# Patient Record
Sex: Female | Born: 1955
Health system: Southern US, Community
[De-identification: ages and names within clinical notes are randomized; demographics above are authoritative.]

## PROBLEM LIST (undated history)

## (undated) DIAGNOSIS — E785 Hyperlipidemia, unspecified: Secondary | ICD-10-CM

## (undated) DIAGNOSIS — I10 Essential (primary) hypertension: Secondary | ICD-10-CM

## (undated) DIAGNOSIS — I639 Cerebral infarction, unspecified: Secondary | ICD-10-CM

## (undated) HISTORY — PX: TUBAL LIGATION: SHX77

## (undated) HISTORY — DX: Hyperlipidemia, unspecified: E78.5

---

## 2000-12-23 ENCOUNTER — Ambulatory Visit (HOSPITAL_COMMUNITY): Admission: RE | Admit: 2000-12-23 | Discharge: 2000-12-23 | Payer: Self-pay | Admitting: Family Medicine

## 2000-12-23 ENCOUNTER — Encounter: Payer: Self-pay | Admitting: Family Medicine

## 2002-10-25 ENCOUNTER — Emergency Department (HOSPITAL_COMMUNITY): Admission: EM | Admit: 2002-10-25 | Discharge: 2002-10-25 | Payer: Self-pay | Admitting: Emergency Medicine

## 2002-12-06 ENCOUNTER — Emergency Department (HOSPITAL_COMMUNITY): Admission: EM | Admit: 2002-12-06 | Discharge: 2002-12-06 | Payer: Self-pay | Admitting: *Deleted

## 2003-09-01 ENCOUNTER — Emergency Department (HOSPITAL_COMMUNITY): Admission: EM | Admit: 2003-09-01 | Discharge: 2003-09-01 | Payer: Self-pay | Admitting: Emergency Medicine

## 2003-12-27 ENCOUNTER — Emergency Department (HOSPITAL_COMMUNITY): Admission: EM | Admit: 2003-12-27 | Discharge: 2003-12-27 | Payer: Self-pay | Admitting: *Deleted

## 2004-09-05 ENCOUNTER — Emergency Department (HOSPITAL_COMMUNITY): Admission: EM | Admit: 2004-09-05 | Discharge: 2004-09-05 | Payer: Self-pay | Admitting: Emergency Medicine

## 2005-09-29 ENCOUNTER — Emergency Department (HOSPITAL_COMMUNITY): Admission: EM | Admit: 2005-09-29 | Discharge: 2005-09-29 | Payer: Self-pay | Admitting: Emergency Medicine

## 2006-05-27 ENCOUNTER — Emergency Department (HOSPITAL_COMMUNITY): Admission: EM | Admit: 2006-05-27 | Discharge: 2006-05-27 | Payer: Self-pay | Admitting: Emergency Medicine

## 2006-09-11 ENCOUNTER — Emergency Department (HOSPITAL_COMMUNITY): Admission: EM | Admit: 2006-09-11 | Discharge: 2006-09-11 | Payer: Self-pay | Admitting: Emergency Medicine

## 2006-10-02 ENCOUNTER — Emergency Department (HOSPITAL_COMMUNITY): Admission: EM | Admit: 2006-10-02 | Discharge: 2006-10-02 | Payer: Self-pay | Admitting: Emergency Medicine

## 2008-02-11 ENCOUNTER — Emergency Department (HOSPITAL_COMMUNITY): Admission: EM | Admit: 2008-02-11 | Discharge: 2008-02-11 | Payer: Self-pay | Admitting: Emergency Medicine

## 2009-04-10 ENCOUNTER — Emergency Department (HOSPITAL_COMMUNITY): Admission: EM | Admit: 2009-04-10 | Discharge: 2009-04-10 | Payer: Self-pay | Admitting: Emergency Medicine

## 2009-11-03 ENCOUNTER — Emergency Department (HOSPITAL_COMMUNITY): Admission: EM | Admit: 2009-11-03 | Discharge: 2009-11-04 | Payer: Self-pay | Admitting: Emergency Medicine

## 2010-06-30 ENCOUNTER — Encounter: Payer: Self-pay | Admitting: Family Medicine

## 2010-09-11 LAB — POCT CARDIAC MARKERS: Troponin i, poc: <0.05 ng/mL (ref 0.00–0.09)

## 2010-09-11 LAB — URINALYSIS, ROUTINE W REFLEX MICROSCOPIC
Nitrite: NEGATIVE
Specific Gravity, Urine: 1.01 (ref 1.005–1.030)
pH: 7 (ref 5.0–8.0)

## 2010-09-11 LAB — BASIC METABOLIC PANEL
GFR calc non Af Amer: >60 mL/min (ref >60)
Potassium: 3.1 mEq/L — ABNORMAL LOW (ref 3.5–5.1)
Sodium: 139 mEq/L (ref 135–145)

## 2010-09-11 LAB — DIFFERENTIAL
Eosinophils Relative: 3 % (ref 0–5)
Lymphocytes Relative: 36 % (ref 12–46)
Lymphs Abs: 1.6 10*3/uL (ref 0.7–4.0)
Monocytes Absolute: 0.3 10*3/uL (ref 0.1–1.0)

## 2010-09-11 LAB — HEPATIC FUNCTION PANEL
AST: 30 U/L (ref 0–37)
Albumin: 4.1 g/dL (ref 3.5–5.2)
Alkaline Phosphatase: 56 U/L (ref 39–117)
Total Protein: 7.6 g/dL (ref 6.0–8.3)

## 2010-09-11 LAB — CBC
HCT: 36.3 % (ref 36.0–46.0)
Hemoglobin: 12.4 g/dL (ref 12.0–15.0)
RBC: 4.24 MIL/uL (ref 3.87–5.11)
WBC: 4.4 10*3/uL (ref 4.0–10.5)

## 2010-09-11 LAB — LIPASE, BLOOD: Lipase: 24 U/L (ref 11–59)

## 2010-11-16 ENCOUNTER — Emergency Department (HOSPITAL_COMMUNITY)
Admission: EM | Admit: 2010-11-16 | Discharge: 2010-11-16 | Disposition: A | Discharge status: Home / Self Care | Payer: Self-pay | Attending: Emergency Medicine | Admitting: Emergency Medicine

## 2010-11-16 DIAGNOSIS — W57XXXA Bitten or stung by nonvenomous insect and other nonvenomous arthropods, initial encounter: Secondary | ICD-10-CM | POA: Insufficient documentation

## 2010-11-16 DIAGNOSIS — T148 Other injury of unspecified body region: Secondary | ICD-10-CM | POA: Insufficient documentation

## 2011-02-25 ENCOUNTER — Other Ambulatory Visit (HOSPITAL_COMMUNITY): Payer: Self-pay | Admitting: Family Medicine

## 2011-02-25 DIAGNOSIS — Z139 Encounter for screening, unspecified: Secondary | ICD-10-CM

## 2011-03-04 ENCOUNTER — Ambulatory Visit (HOSPITAL_COMMUNITY)
Admission: RE | Admit: 2011-03-04 | Discharge: 2011-03-04 | Disposition: A | Discharge status: Home / Self Care | Payer: PRIVATE HEALTH INSURANCE | Source: Ambulatory Visit | Attending: Family Medicine | Admitting: Family Medicine

## 2011-03-04 DIAGNOSIS — Z139 Encounter for screening, unspecified: Secondary | ICD-10-CM

## 2011-03-04 DIAGNOSIS — Z1231 Encounter for screening mammogram for malignant neoplasm of breast: Secondary | ICD-10-CM | POA: Insufficient documentation

## 2011-09-01 ENCOUNTER — Emergency Department (HOSPITAL_COMMUNITY)
Admission: EM | Admit: 2011-09-01 | Discharge: 2011-09-01 | Disposition: A | Discharge status: Home / Self Care | Payer: Self-pay | Attending: Emergency Medicine | Admitting: Emergency Medicine

## 2011-09-01 ENCOUNTER — Encounter (HOSPITAL_COMMUNITY): Payer: Self-pay | Admitting: *Deleted

## 2011-09-01 DIAGNOSIS — I10 Essential (primary) hypertension: Secondary | ICD-10-CM | POA: Insufficient documentation

## 2011-09-01 DIAGNOSIS — K047 Periapical abscess without sinus: Secondary | ICD-10-CM | POA: Insufficient documentation

## 2011-09-01 DIAGNOSIS — K029 Dental caries, unspecified: Secondary | ICD-10-CM | POA: Insufficient documentation

## 2011-09-01 DIAGNOSIS — R220 Localized swelling, mass and lump, head: Secondary | ICD-10-CM | POA: Insufficient documentation

## 2011-09-01 DIAGNOSIS — K089 Disorder of teeth and supporting structures, unspecified: Secondary | ICD-10-CM | POA: Insufficient documentation

## 2011-09-01 HISTORY — DX: Essential (primary) hypertension: I10

## 2011-09-01 MED ORDER — HYDROCODONE-ACETAMINOPHEN 5-325 MG PO TABS
1.0000 | ORAL_TABLET | Freq: Once | ORAL | Status: AC
  Administered 2011-09-01: 1 via ORAL
  Filled 2011-09-01: qty 1

## 2011-09-01 MED ORDER — IBUPROFEN 800 MG PO TABS
800.0000 mg | ORAL_TABLET | Freq: Once | ORAL | Status: AC
  Administered 2011-09-01: 800 mg via ORAL
  Filled 2011-09-01: qty 1

## 2011-09-01 MED ORDER — PENICILLIN V POTASSIUM 500 MG PO TABS: 500.0000 mg | ORAL_TABLET | Freq: Four times a day (QID) | ORAL | Status: AC

## 2011-09-01 MED ORDER — HYDROCODONE-ACETAMINOPHEN 5-325 MG PO TABS: 1.0000 | ORAL_TABLET | Freq: Four times a day (QID) | ORAL | Status: AC | PRN

## 2011-09-01 MED ORDER — PENICILLIN V POTASSIUM 250 MG PO TABS
500.0000 mg | ORAL_TABLET | Freq: Once | ORAL | Status: AC
  Administered 2011-09-01: 500 mg via ORAL
  Filled 2011-09-01: qty 2

## 2011-09-01 NOTE — ED Notes (Signed)
Dental abscess left upper jaw

## 2011-09-01 NOTE — ED Provider Notes (Signed)
History     CSN: [id number]  Arrival date & time 09/01/11  7846   First MD Initiated Contact with Patient 09/01/11 2031      Chief Complaint  Patient presents with  . Dental Pain    (Consider location/radiation/quality/duration/timing/severity/associated sxs/prior treatment) HPI Comments: Tooth "broke off" 2 days ago with swelling and increasing pain.  No dentist.  Took tylenol earlier with significant relief.  Patient is a 56 y.o. female presenting with tooth pain. The history is provided by the patient. No language interpreter was used.  Dental PainThe primary symptoms include mouth pain. The symptoms began 2 days ago. The symptoms are worsening. The symptoms occur constantly.  Additional symptoms include: facial swelling.    Past Medical History  Diagnosis Date  . Hypertension     Past Surgical History  Procedure Date  . Tubal ligation     No family history on file.  History  Substance Use Topics  . Smoking status: Never Smoker   . Smokeless tobacco: Not on file  . Alcohol Use: No    OB History    Grav Para Term Preterm Abortions TAB SAB Ect Mult Living                  Review of Systems  HENT: Positive for facial swelling and dental problem.   All other systems reviewed and are negative.    Allergies  Bee venom and Shellfish allergy  Home Medications   Current Outpatient Rx  Name Route Sig Dispense Refill  . ACETAMINOPHEN 500 MG PO TABS Oral Take 500 mg by mouth once as needed. For pain    . BENAZEPRIL-HYDROCHLOROTHIAZIDE 20-12.5 MG PO TABS Oral Take 1 tablet by mouth daily.    Marland Kitchen DIPHENHYDRAMINE HCL 25 MG PO TABS Oral Take 25 mg by mouth as needed. For allergy symptoms    . OMEGA-3 FATTY ACIDS 1000 MG PO CAPS Oral Take 1 g by mouth 2 (two) times daily.    Marland Kitchen HYDROCHLOROTHIAZIDE 12.5 MG PO CAPS Oral Take 12.5 mg by mouth daily.    . ADULT MULTIVITAMIN W/MINERALS CH Oral Take 1 tablet by mouth daily.      BP 181/96  Pulse 100  Temp(Src) 98.6 F  (37 C) (Oral)  Resp 20  Ht 5\' 2"  (1.575 m)  Wt 144 lb (65.318 kg)  BMI 26.34 kg/m2  SpO2 100%  Physical Exam  Nursing note and vitals reviewed. Constitutional: She is oriented to person, place, and time. She appears well-developed and well-nourished. No distress.  HENT:  Head: Normocephalic and atraumatic.    Mouth/Throat: Uvula is midline and mucous membranes are normal. Dental abscesses and dental caries present. No uvula swelling. No oropharyngeal exudate, posterior oropharyngeal edema, posterior oropharyngeal erythema or tonsillar abscesses.    Eyes: EOM are normal.  Neck: Normal range of motion.  Cardiovascular: Normal rate, regular rhythm and normal heart sounds.   Pulmonary/Chest: Effort normal and breath sounds normal.  Abdominal: Soft. She exhibits no distension. There is no tenderness.  Musculoskeletal: Normal range of motion.  Neurological: She is alert and oriented to person, place, and time.  Skin: Skin is warm and dry.  Psychiatric: She has a normal mood and affect. Judgment normal.    ED Course  Procedures (including critical care time)  Labs Reviewed - No data to display No results found.   1. Dental abscess       MDM  rx-pen VK, 40 rx-hydrocodone, 20 OTC ibuprofen F/u with dentist ASAP  Worthy Rancher, PA 09/01/11 2101  Worthy Rancher, PA 09/01/11 2103

## 2011-09-01 NOTE — Discharge Instructions (Signed)
Dental Abscess A dental abscess usually starts from an infected tooth. Antibiotic medicine and pain pills can be helpful, but dental infections require the attention of a dentist. Rinse around the infected area often with salt water (a pinch of salt in 8 oz of warm water). Do not apply heat to the outside of your face. See your dentist or oral surgeon as soon as possible.  SEEK IMMEDIATE MEDICAL CARE IF:  You have increasing, severe pain that is not relieved by medicine.   You or your child has an oral temperature above 102 F (38.9 C), not controlled by medicine.   Your baby is older than 3 months with a rectal temperature of 102 F (38.9 C) or higher.   Your baby is 39 months old or younger with a rectal temperature of 100.4 F (38 C) or higher.   You develop chills, severe headache, difficulty breathing, or trouble swallowing.   You have swelling in the neck or around the eye.  Document Released: 05/26/2005 Document Revised: 05/15/2011 Document Reviewed: 11/04/2006 [facility] Patient Information 2012 [place], [place].   Take the meds as directed.  Take ibuprofen up to 800 mg every 8 hrs with food.  Follow up  With the dentist of your choice ASAP.

## 2011-09-05 NOTE — ED Provider Notes (Signed)
Medical screening examination/treatment/procedure(s) were performed by non-physician practitioner and as supervising physician I was immediately available for consultation/collaboration.   Annaleese Guier W. Zyan Mirkin, MD 09/05/11 0058 

## 2013-03-16 ENCOUNTER — Encounter: Payer: Self-pay | Admitting: General Practice

## 2013-03-16 ENCOUNTER — Encounter (INDEPENDENT_AMBULATORY_CARE_PROVIDER_SITE_OTHER): Payer: Self-pay

## 2013-03-16 ENCOUNTER — Ambulatory Visit (INDEPENDENT_AMBULATORY_CARE_PROVIDER_SITE_OTHER): Payer: BC Managed Care – PPO | Admitting: General Practice

## 2013-03-16 VITALS — BP 165/91 | HR 86 | Temp 97.6°F | Ht 61.5 in | Wt 156.0 lb

## 2013-03-16 DIAGNOSIS — Z7189 Other specified counseling: Secondary | ICD-10-CM

## 2013-03-16 DIAGNOSIS — I10 Essential (primary) hypertension: Secondary | ICD-10-CM

## 2013-03-16 DIAGNOSIS — Z7689 Persons encountering health services in other specified circumstances: Secondary | ICD-10-CM

## 2013-03-16 MED ORDER — AMLODIPINE BESY-BENAZEPRIL HCL 5-20 MG PO CAPS: 1.0000 | ORAL_CAPSULE | Freq: Every day | ORAL | Status: DC

## 2013-03-16 NOTE — Patient Instructions (Signed)

## 2013-03-16 NOTE — Progress Notes (Signed)
  Subjective:    Patient ID: Tiffany King, female    DOB: 01-31-1956, 57 y.o.   MRN: [id number]  HPI Patient presents today to establish care. She was being seen by local health department for hypertension and acid reflux. She reports being treated for high blood pressure intermittently for 17 years. She reports not taking medications for past two weeks and recently restarted on September 30th. Reports blood pressure normally range 130's/90's.  Reports trying to eat healthy, tossed salads, chicken, fish, baked foods, no salt seasoning. Reports walking at least three times weekly.    Review of Systems  Constitutional: Negative for fever and chills.  Respiratory: Negative for chest tightness and shortness of breath.   Cardiovascular: Negative for chest pain and palpitations.  Gastrointestinal: Negative for nausea, vomiting, abdominal pain, diarrhea, constipation and blood in stool.  Genitourinary: Negative for dysuria, hematuria and difficulty urinating.  Musculoskeletal: Negative for back pain.  Neurological: Negative for dizziness, weakness and headaches.       Objective:   Physical Exam  Constitutional: She is oriented to person, place, and time. She appears well-developed and well-nourished.  HENT:  Head: Normocephalic and atraumatic.  Right Ear: External ear normal.  Left Ear: External ear normal.  Mouth/Throat: Oropharynx is clear and moist.  Eyes: Conjunctivae and EOM are normal. Pupils are equal, round, and reactive to light.  Neck: Normal range of motion. Neck supple. No thyromegaly present.  Cardiovascular: Normal rate, regular rhythm and normal heart sounds.   Pulmonary/Chest: Effort normal and breath sounds normal. No respiratory distress. She exhibits no tenderness.  Abdominal: Soft. Bowel sounds are normal. She exhibits no distension. There is no tenderness.  Lymphadenopathy:    She has no cervical adenopathy.  Neurological: She is alert and oriented to person, place,  and time.  Skin: Skin is warm and dry.  Psychiatric: She has a normal mood and affect.          Assessment & Plan:  1. Hypertension  - amLODipine-benazepril (LOTREL) 5-20 MG per capsule; Take 1 capsule by mouth daily.  Dispense: 30 capsule; Refill: 0 -discontinue Lotensin HCT 20-12.5 - NMR, lipoprofile - CMP14+EGFR -RTO in 2 weeks for blood pressure recheck  2. Encounter to establish care - POCT CBC Discussed healthy eating and regular exercise -Patient verbalized understanding Coralie Keens, FNP-C

## 2013-03-18 ENCOUNTER — Other Ambulatory Visit: Payer: Self-pay | Admitting: General Practice

## 2013-03-18 DIAGNOSIS — E785 Hyperlipidemia, unspecified: Secondary | ICD-10-CM

## 2013-03-18 LAB — CMP14+EGFR
Albumin: 4.9 g/dL (ref 3.5–5.5)
Alkaline Phosphatase: 61 IU/L (ref 39–117)
BUN/Creatinine Ratio: 9 (ref 9–23)
BUN: 9 mg/dL (ref 6–24)
CO2: 29 mmol/L (ref 18–29)
Calcium: 10.3 mg/dL — ABNORMAL HIGH (ref 8.7–10.2)
Creatinine, Ser: 1.01 mg/dL — ABNORMAL HIGH (ref 0.57–1.00)
Globulin, Total: 2.6 g/dL (ref 1.5–4.5)
Total Protein: 7.5 g/dL (ref 6.0–8.5)

## 2013-03-18 LAB — NMR, LIPOPROFILE
LDL Particle Number: 1528 nmol/L — ABNORMAL HIGH (ref <1000)
LDL Size: 20.7 nm (ref >20.5)
Triglycerides by NMR: 151 mg/dL — ABNORMAL HIGH (ref <150)

## 2013-03-18 MED ORDER — ATORVASTATIN CALCIUM 10 MG PO TABS: 10.0000 mg | ORAL_TABLET | Freq: Every day | ORAL | Status: DC

## 2013-03-30 ENCOUNTER — Encounter: Payer: Self-pay | Admitting: *Deleted

## 2013-04-01 ENCOUNTER — Ambulatory Visit: Payer: BC Managed Care – PPO | Admitting: General Practice

## 2013-04-05 ENCOUNTER — Encounter: Payer: Self-pay | Admitting: General Practice

## 2013-04-05 ENCOUNTER — Ambulatory Visit (INDEPENDENT_AMBULATORY_CARE_PROVIDER_SITE_OTHER): Payer: BC Managed Care – PPO | Admitting: General Practice

## 2013-04-05 VITALS — BP 159/84 | HR 84 | Temp 97.0°F | Ht 61.5 in | Wt 156.0 lb

## 2013-04-05 DIAGNOSIS — I10 Essential (primary) hypertension: Secondary | ICD-10-CM

## 2013-04-05 MED ORDER — AMLODIPINE BESY-BENAZEPRIL HCL 5-20 MG PO CAPS: 1.0000 | ORAL_CAPSULE | Freq: Every day | ORAL | Status: DC

## 2013-04-05 NOTE — Progress Notes (Signed)
  Subjective:    Patient ID: Tiffany King, female    DOB: 1955/10/22, 57 y.o.   MRN: [id number]  HPI Patient presents today for follow up of blood pressure. She reports checking blood pressures at home and in stores, with readings of 130's/70's. Reports taking medications as directed. Reports eating healthy diet and walking for exercise. Denies any complaints at this time.     Review of Systems  Constitutional: Negative for fever and chills.  Respiratory: Negative for chest tightness and shortness of breath.   Cardiovascular: Negative for chest pain and palpitations.  Gastrointestinal: Negative for nausea, vomiting, abdominal pain, diarrhea, constipation and blood in stool.  Genitourinary: Negative for dysuria, hematuria and difficulty urinating.  Musculoskeletal: Negative for back pain.  Neurological: Negative for dizziness, weakness and headaches.       Objective:   Physical Exam  Constitutional: She is oriented to person, place, and time. She appears well-developed and well-nourished.  HENT:  Head: Normocephalic and atraumatic.  Right Ear: External ear normal.  Left Ear: External ear normal.  Mouth/Throat: Oropharynx is clear and moist.  Eyes: Conjunctivae and EOM are normal. Pupils are equal, round, and reactive to light.  Neck: Normal range of motion. Neck supple. No thyromegaly present.  Cardiovascular: Normal rate, regular rhythm and normal heart sounds.   Pulmonary/Chest: Effort normal and breath sounds normal. No respiratory distress. She exhibits no tenderness.  Abdominal: Soft. Bowel sounds are normal. She exhibits no distension. There is no tenderness.  Lymphadenopathy:    She has no cervical adenopathy.  Neurological: She is alert and oriented to person, place, and time.  Skin: Skin is warm and dry.  Psychiatric: She has a normal mood and affect.          Assessment & Plan:  1. Hypertension - amLODipine-benazepril (LOTREL) 5-20 MG per capsule; Take 1  capsule by mouth daily.  Dispense: 30 capsule; Refill: 3 -keep blood pressure diary and bring to next visit -continue healthy eating and some form of regular exercise -Patient verbalized understanding -Coralie Keens, FNP-C

## 2013-08-16 ENCOUNTER — Ambulatory Visit (INDEPENDENT_AMBULATORY_CARE_PROVIDER_SITE_OTHER): Payer: BC Managed Care – PPO | Admitting: General Practice

## 2013-08-16 ENCOUNTER — Encounter: Payer: Self-pay | Admitting: General Practice

## 2013-08-16 VITALS — BP 156/86 | HR 93 | Temp 98.1°F | Ht 61.5 in | Wt 162.0 lb

## 2013-08-16 DIAGNOSIS — I10 Essential (primary) hypertension: Secondary | ICD-10-CM

## 2013-08-16 DIAGNOSIS — E785 Hyperlipidemia, unspecified: Secondary | ICD-10-CM

## 2013-08-16 MED ORDER — AMLODIPINE BESY-BENAZEPRIL HCL 5-20 MG PO CAPS: 1.0000 | ORAL_CAPSULE | Freq: Every day | ORAL | Status: DC

## 2013-08-16 MED ORDER — ATORVASTATIN CALCIUM 10 MG PO TABS: 10.0000 mg | ORAL_TABLET | Freq: Every day | ORAL | Status: DC

## 2013-08-16 NOTE — Patient Instructions (Signed)
Exercise to Stay Healthy Exercise helps you become and stay healthy. EXERCISE IDEAS AND TIPS Choose exercises that:  You enjoy.  Fit into your day. You do not need to exercise really hard to be healthy. You can do exercises at a slow or medium level and stay healthy. You can:  Stretch before and after working out.  Try yoga, Pilates, or tai chi.  Lift weights.  Walk fast, swim, jog, run, climb stairs, bicycle, dance, or rollerskate.  Take aerobic classes. Exercises that burn about 150 calories:  Running 1  miles in 15 minutes.  Playing volleyball for 45 to 60 minutes.  Washing and waxing a car for 45 to 60 minutes.  Playing touch football for 45 minutes.  Walking 1  miles in 35 minutes.  Pushing a stroller 1  miles in 30 minutes.  Playing basketball for 30 minutes.  Raking leaves for 30 minutes.  Bicycling 5 miles in 30 minutes.  Walking 2 miles in 30 minutes.  Dancing for 30 minutes.  Shoveling snow for 15 minutes.  Swimming laps for 20 minutes.  Walking up stairs for 15 minutes.  Bicycling 4 miles in 15 minutes.  Gardening for 30 to 45 minutes.  Jumping rope for 15 minutes.  Washing windows or floors for 45 to 60 minutes. Document Released: 06/28/2010 Document Revised: 08/18/2011 Document Reviewed: 06/28/2010 [employer] Patient Information 2014 [employer].  

## 2013-08-16 NOTE — Progress Notes (Signed)
   Subjective:    Patient ID: Tiffany King, female    DOB: September 11, 1955, 58 y.o.   MRN: [id number]  HPI Patient presents today for chronic health follow up. History of HTN, HLD, and Gerd. Taking medications as directed. Eating healthy and working on increasing exercise routine.     Review of Systems  Constitutional: Negative for fever and chills.  Respiratory: Negative for chest tightness and shortness of breath.   Cardiovascular: Negative for chest pain and palpitations.  Gastrointestinal: Negative for nausea, vomiting, abdominal pain, diarrhea, constipation and blood in stool.  Genitourinary: Negative for dysuria, hematuria and difficulty urinating.  Musculoskeletal: Negative for back pain.  Neurological: Negative for dizziness, weakness and headaches.  All other systems reviewed and are negative.       Objective:   Physical Exam  Constitutional: She is oriented to person, place, and time. She appears well-developed and well-nourished.  HENT:  Head: Normocephalic and atraumatic.  Right Ear: External ear normal.  Left Ear: External ear normal.  Mouth/Throat: Oropharynx is clear and moist.  Eyes: Conjunctivae and EOM are normal. Pupils are equal, round, and reactive to light.  Neck: Normal range of motion. Neck supple. No thyromegaly present.  Cardiovascular: Normal rate, regular rhythm and normal heart sounds.   Pulmonary/Chest: Effort normal and breath sounds normal. No respiratory distress. She exhibits no tenderness.  Abdominal: Soft. Bowel sounds are normal. She exhibits no distension. There is no tenderness.  Lymphadenopathy:    She has no cervical adenopathy.  Neurological: She is alert and oriented to person, place, and time.  Skin: Skin is warm and dry.  Psychiatric: She has a normal mood and affect.          Assessment & Plan:  1.Hypertension  - amLODipine-benazepril (LOTREL) 5-20 MG per capsule; Take 1 capsule by mouth daily.  Dispense: 30 capsule; Refill:  5 - CMP14+EGFR  2. HLD (hyperlipidemia)  - atorvastatin (LIPITOR) 10 MG tablet; Take 1 tablet (10 mg total) by mouth daily.  Dispense: 30 tablet; Refill: 5 - Lipid panel -Continue all current medications Labs pending F/u in 3 months Discussed benefits of regular exercise and healthy eating Patient verbalized understanding Erby Pian, FNP-C

## 2013-08-17 LAB — CMP14+EGFR
ALT: 30 IU/L (ref 0–32)
AST: 24 IU/L (ref 0–40)
Albumin/Globulin Ratio: 1.8 (ref 1.1–2.5)
Albumin: 4.9 g/dL (ref 3.5–5.5)
Alkaline Phosphatase: 69 IU/L (ref 39–117)
BUN/Creatinine Ratio: 10 (ref 9–23)
BUN: 10 mg/dL (ref 6–24)
CALCIUM: 10.5 mg/dL — AB (ref 8.7–10.2)
CHLORIDE: 104 mmol/L (ref 97–108)
CO2: 22 mmol/L (ref 18–29)
Creatinine, Ser: 1.03 mg/dL — ABNORMAL HIGH (ref 0.57–1.00)
GFR calc Af Amer: 69 mL/min/{1.73_m2} (ref >59)
GFR calc non Af Amer: 60 mL/min/{1.73_m2} (ref >59)
GLUCOSE: 74 mg/dL (ref 65–99)
Globulin, Total: 2.7 g/dL (ref 1.5–4.5)
POTASSIUM: 4 mmol/L (ref 3.5–5.2)
SODIUM: 146 mmol/L — AB (ref 134–144)
TOTAL PROTEIN: 7.6 g/dL (ref 6.0–8.5)
Total Bilirubin: 0.2 mg/dL (ref 0.0–1.2)

## 2013-08-17 LAB — LIPID PANEL
CHOLESTEROL TOTAL: 243 mg/dL — AB (ref 100–199)
Chol/HDL Ratio: 4.6 ratio units — ABNORMAL HIGH (ref 0.0–4.4)
HDL: 53 mg/dL (ref >39)
LDL Calculated: 135 mg/dL — ABNORMAL HIGH (ref 0–99)
TRIGLYCERIDES: 276 mg/dL — AB (ref 0–149)
VLDL Cholesterol Cal: 55 mg/dL — ABNORMAL HIGH (ref 5–40)

## 2013-08-19 DIAGNOSIS — E785 Hyperlipidemia, unspecified: Secondary | ICD-10-CM | POA: Insufficient documentation

## 2013-08-19 DIAGNOSIS — I10 Essential (primary) hypertension: Secondary | ICD-10-CM | POA: Insufficient documentation

## 2013-08-24 ENCOUNTER — Encounter: Payer: Self-pay | Admitting: General Practice

## 2013-08-24 ENCOUNTER — Ambulatory Visit (INDEPENDENT_AMBULATORY_CARE_PROVIDER_SITE_OTHER): Payer: BC Managed Care – PPO | Admitting: General Practice

## 2013-08-24 ENCOUNTER — Telehealth: Payer: Self-pay | Admitting: *Deleted

## 2013-08-24 VITALS — BP 154/82 | HR 96 | Temp 97.2°F | Ht 61.0 in | Wt 162.0 lb

## 2013-08-24 DIAGNOSIS — Z9109 Other allergy status, other than to drugs and biological substances: Secondary | ICD-10-CM

## 2013-08-24 DIAGNOSIS — Z889 Allergy status to unspecified drugs, medicaments and biological substances status: Secondary | ICD-10-CM

## 2013-08-24 DIAGNOSIS — L509 Urticaria, unspecified: Secondary | ICD-10-CM

## 2013-08-24 DIAGNOSIS — I10 Essential (primary) hypertension: Secondary | ICD-10-CM

## 2013-08-24 MED ORDER — LISINOPRIL-HYDROCHLOROTHIAZIDE 20-12.5 MG PO TABS: 1.0000 | ORAL_TABLET | Freq: Every day | ORAL | Status: DC

## 2013-08-24 NOTE — Patient Instructions (Signed)

## 2013-08-24 NOTE — Telephone Encounter (Signed)
Took Bp medication this am and broke out in hives all over her.

## 2013-08-24 NOTE — Telephone Encounter (Signed)
Patient was seen in our office today.  

## 2013-08-24 NOTE — Progress Notes (Signed)
   Subjective:    Patient ID: Tiffany King, female    DOB: 12/10/1955, 58 y.o.   MRN: [id number]  Urticaria This is a new problem. The current episode started more than 1 month ago. The problem has been gradually worsening since onset. The rash is diffuse. The rash is characterized by itchiness and redness. It is unknown if there was an exposure to a precipitant. Pertinent negatives include no cough, diarrhea or fever. Past treatments include antihistamine. The treatment provided significant relief. There is no history of allergies, asthma or eczema.       Review of Systems  Constitutional: Negative for fever and chills.  Respiratory: Negative for cough and chest tightness.   Cardiovascular: Negative for chest pain and palpitations.  Gastrointestinal: Negative for abdominal pain, diarrhea, constipation and blood in stool.  Skin: Positive for rash.       Red, itchy rash diffuse  Neurological: Negative for dizziness, weakness and headaches.       Objective:   Physical Exam  Constitutional: She is oriented to person, place, and time. She appears well-developed and well-nourished.  HENT:  Head: Normocephalic and atraumatic.  Right Ear: External ear normal.  Left Ear: External ear normal.  Mouth/Throat: Oropharynx is clear and moist.  Eyes: Pupils are equal, round, and reactive to light.  Neck: Normal range of motion. Neck supple.  Cardiovascular: Normal rate, regular rhythm and normal heart sounds.   Pulmonary/Chest: Effort normal and breath sounds normal. No respiratory distress. She exhibits no tenderness.  Abdominal: Soft. Bowel sounds are normal. She exhibits no distension. There is no tenderness.  Lymphadenopathy:    She has no cervical adenopathy.  Neurological: She is alert and oriented to person, place, and time.  Skin: Skin is warm and dry.  Psychiatric: She has a normal mood and affect.          Assessment & Plan:  1. Hypertension  -  lisinopril-hydrochlorothiazide (PRINZIDE,ZESTORETIC) 20-12.5 MG per tablet; Take 1 tablet by mouth daily.  Dispense: 30 tablet; Refill:1 -discontinue lotrel   2. Hives, 3. Multiple allergies  - Ambulatory referral to Allergy -keep skin clean and dry-RTO if symptoms worsen or unresolved May seek emergency medical treatment Patient verbalized understanding Tiffany KeensMae E. Kyren Vaux, FNP-C

## 2013-09-05 ENCOUNTER — Telehealth: Payer: Self-pay | Admitting: General Practice

## 2013-09-12 NOTE — Telephone Encounter (Signed)
Patient aware.

## 2013-09-12 NOTE — Telephone Encounter (Signed)
Please inform patient if symptoms reoccur please notify office.

## 2013-11-09 ENCOUNTER — Encounter (HOSPITAL_COMMUNITY): Payer: Self-pay | Admitting: Emergency Medicine

## 2013-11-09 ENCOUNTER — Emergency Department (HOSPITAL_COMMUNITY)
Admission: EM | Admit: 2013-11-09 | Discharge: 2013-11-09 | Disposition: A | Discharge status: Home / Self Care | Payer: BC Managed Care – PPO | Attending: Emergency Medicine | Admitting: Emergency Medicine

## 2013-11-09 DIAGNOSIS — Z79899 Other long term (current) drug therapy: Secondary | ICD-10-CM | POA: Insufficient documentation

## 2013-11-09 DIAGNOSIS — I10 Essential (primary) hypertension: Secondary | ICD-10-CM | POA: Insufficient documentation

## 2013-11-09 DIAGNOSIS — W57XXXA Bitten or stung by nonvenomous insect and other nonvenomous arthropods, initial encounter: Secondary | ICD-10-CM | POA: Insufficient documentation

## 2013-11-09 DIAGNOSIS — Y939 Activity, unspecified: Secondary | ICD-10-CM | POA: Insufficient documentation

## 2013-11-09 DIAGNOSIS — S30860A Insect bite (nonvenomous) of lower back and pelvis, initial encounter: Secondary | ICD-10-CM | POA: Insufficient documentation

## 2013-11-09 DIAGNOSIS — Y929 Unspecified place or not applicable: Secondary | ICD-10-CM | POA: Insufficient documentation

## 2013-11-09 DIAGNOSIS — E785 Hyperlipidemia, unspecified: Secondary | ICD-10-CM | POA: Insufficient documentation

## 2013-11-09 MED ORDER — HYDROCODONE-ACETAMINOPHEN 5-325 MG PO TABS
1.0000 | ORAL_TABLET | Freq: Once | ORAL | Status: AC
  Administered 2013-11-09: 1 via ORAL
  Filled 2013-11-09: qty 1

## 2013-11-09 MED ORDER — ONDANSETRON HCL 4 MG PO TABS
4.0000 mg | ORAL_TABLET | Freq: Once | ORAL | Status: AC
Start: 2013-11-09 — End: 2013-11-09
  Administered 2013-11-09: 4 mg via ORAL
  Filled 2013-11-09: qty 1

## 2013-11-09 MED ORDER — DOXYCYCLINE HYCLATE 100 MG PO TABS
100.0000 mg | ORAL_TABLET | Freq: Once | ORAL | Status: AC
  Administered 2013-11-09: 100 mg via ORAL
  Filled 2013-11-09: qty 1

## 2013-11-09 MED ORDER — MINOCYCLINE HCL 100 MG PO CAPS: 100.0000 mg | ORAL_CAPSULE | Freq: Two times a day (BID) | ORAL | Status: DC

## 2013-11-09 NOTE — Discharge Instructions (Signed)
Please cleanse the wound with soap and water. Apply a bandage. Use minocin daily with food until all taken. Please have your blood pressure rechecked soon. Tick Bite Information Ticks are insects that attach themselves to the skin. There are many types of ticks. Common types include wood ticks and deer ticks. Sometimes, ticks carry diseases that can make a person very ill. The most common places for ticks to attach themselves are the scalp, neck, armpits, waist, and groin.  HOW CAN YOU PREVENT TICK BITES? Take these steps to help prevent tick bites when you are outdoors:  Wear long sleeves and long pants.  Wear white clothes so you can see ticks more easily.  Tuck your pant legs into your socks.  If walking on a trail, stay in the middle of the trail to avoid brushing against bushes.  Avoid walking through areas with long grass.  Put bug spray on all skin that is showing and along boot tops, pant legs, and sleeve cuffs.  Check clothes, hair, and skin often and before going inside.  Brush off any ticks that are not attached.  Take a shower or bath as soon as possible after being outdoors. HOW SHOULD YOU REMOVE A TICK? Ticks should be removed as soon as possible to help prevent diseases. 1. If latex gloves are available, put them on before trying to remove a tick. 2. Use tweezers to grasp the tick as close to the skin as possible. You may also use curved forceps or a tick removal tool. Grasp the tick as close to its head as possible. Avoid grasping the tick on its body. 3. Pull gently upward until the tick lets go. Do not twist the tick or jerk it suddenly. This may break off the tick's head or mouth parts. 4. Do not squeeze or crush the tick's body. This could force disease-carrying fluids from the tick into your body. 5. After the tick is removed, wash the bite area and your hands with soap and water or alcohol. 6. Apply a small amount of antiseptic cream or ointment to the bite  site. 7. Wash any tools that were used. Do not try to remove a tick by applying a hot match, petroleum jelly, or fingernail polish to the tick. These methods do not work. They may also increase the chances of disease being spread from the tick bite. WHEN SHOULD YOU SEEK HELP? Contact your health care provider if you are unable to remove a tick or if a part of the tick breaks off in the skin. After a tick bite, you need to watch for signs and symptoms of diseases that can be spread by ticks. Contact your health care provider if you develop any of the following:  Fever.  Rash.  Redness and puffiness (swelling) in the area of the tick bite.  Tender, puffy lymph glands.  Watery poop (diarrhea).  Weight loss.  Cough.  Feeling more tired than normal (fatigue).  Muscle, joint, or bone pain.  Belly (abdominal) pain.  Headache.  Change in your level of consciousness.  Trouble walking or moving your legs.  Loss of feeling (numbness) in the legs.  Loss of movement (paralysis).  Shortness of breath.  Confusion.  Throwing up (vomiting) many times. Document Released: 08/20/2009 Document Revised: 01/26/2013 Document Reviewed: 11/03/2012 [facility] Patient Information 2014 [place], [place].

## 2013-11-09 NOTE — ED Notes (Signed)
Pt c/o tick bite to the rt flank with swelling since last Thursday.

## 2013-11-09 NOTE — ED Provider Notes (Signed)
CSN: [id number]     Arrival date & time 11/09/13  2152 History   First MD Initiated Contact with Patient 11/09/13 2202     Chief Complaint  Patient presents with  . Insect Bite  . Hypertension     (Consider location/radiation/quality/duration/timing/severity/associated sxs/prior Treatment) HPI Comments: Patient is a 58 year old female who presents to the emergency department with complaint of a tick bite. The patient and the patient's husband states that on Thursday may 28 date pulled a tick from the right flank area. The patient's husband states that he lived the end of some tweezers and got down into the flesh to make sure he got the head and all the parts. The patient states that she squeezed it until it started to bleed to try to get any infection out. The patient now has pain and some swelling around the area where the tick was removed. She saw some mild redness and became concerned because of the possibility of [facility] spotted fever. She denies any fever or chills. She denies any unusual rash. The patient is not diabetic.  The history is provided by the patient and the spouse.    Past Medical History  Diagnosis Date  . Hypertension   . Hyperlipidemia    Past Surgical History  Procedure Laterality Date  . Tubal ligation     Family History  Problem Relation Age of Onset  . Stroke Mother   . Heart disease Mother    History  Substance Use Topics  . Smoking status: Never Smoker   . Smokeless tobacco: Not on file  . Alcohol Use: No   OB History   Grav Para Term Preterm Abortions TAB SAB Ect Mult Living                 Review of Systems  Constitutional: Negative for activity change.       All ROS Neg except as noted in HPI  HENT: Negative for nosebleeds.   Eyes: Negative for photophobia and discharge.  Respiratory: Negative for cough, shortness of breath and wheezing.   Cardiovascular: Negative for chest pain and palpitations.  Gastrointestinal: Negative for  abdominal pain and blood in stool.  Genitourinary: Negative for dysuria, frequency and hematuria.  Musculoskeletal: Negative for arthralgias, back pain and neck pain.  Skin: Negative.   Neurological: Negative for dizziness, seizures and speech difficulty.  Psychiatric/Behavioral: Negative for hallucinations and confusion.      Allergies  Bee venom and Shellfish allergy  Home Medications   Prior to Admission medications   Medication Sig Start Date End Date Taking? Authorizing Provider  acetaminophen (TYLENOL) 500 MG tablet Take 500 mg by mouth every 6 (six) hours as needed. For pain   Yes Historical Provider, MD  atorvastatin (LIPITOR) 10 MG tablet Take 1 tablet (10 mg total) by mouth daily. 08/16/13  Yes Mae Shelda Altes, FNP  fish oil-omega-3 fatty acids 1000 MG capsule Take 1 g by mouth 2 (two) times daily.   Yes Historical Provider, MD  lisinopril-hydrochlorothiazide (PRINZIDE,ZESTORETIC) 20-12.5 MG per tablet Take 1 tablet by mouth daily. 08/24/13  Yes Mae Shelda Altes, FNP  Multiple Vitamin (MULITIVITAMIN WITH MINERALS) TABS Take 1 tablet by mouth daily.   Yes Historical Provider, MD  omeprazole (PRILOSEC) 20 MG capsule Take 20 mg by mouth daily. OTC   Yes Historical Provider, MD   BP 193/101  Pulse 77  Temp(Src) 97.7 F (36.5 C) (Oral)  Resp 20  Ht 5' 1.5" (1.562 m)  Wt 164 lb (74.39  kg)  BMI 30.49 kg/m2  SpO2 100% Physical Exam  Nursing note and vitals reviewed. Constitutional: She is oriented to person, place, and time. She appears well-developed and well-nourished.  Non-toxic appearance.  HENT:  Head: Normocephalic.  Right Ear: Tympanic membrane and external ear normal.  Left Ear: Tympanic membrane and external ear normal.  Eyes: EOM and lids are normal. Pupils are equal, round, and reactive to light.  Neck: Normal range of motion. Neck supple. Carotid bruit is not present.  Cardiovascular: Normal rate, regular rhythm, normal heart sounds, intact distal pulses and  normal pulses.   Pulmonary/Chest: Breath sounds normal. No respiratory distress.  Abdominal: Soft. Bowel sounds are normal. There is no tenderness. There is no guarding.  There is a small noted skin area at the lower right flank area with mild increased redness around the site. The area is tender to palpation. I cannot express any drainage or discharge from the area. There is no red streaking noted.  Musculoskeletal: Normal range of motion.  Lymphadenopathy:       Head (right side): No submandibular adenopathy present.       Head (left side): No submandibular adenopathy present.    She has no cervical adenopathy.  Neurological: She is alert and oriented to person, place, and time. She has normal strength. No cranial nerve deficit or sensory deficit.  Skin: Skin is warm and dry.  Psychiatric: She has a normal mood and affect. Her speech is normal.    ED Course  Procedures (including critical care time) Labs Review Labs Reviewed - No data to display  Imaging Review No results found.   EKG Interpretation None      MDM Patient sustained a tick bite to the right lower flank area. The patient and the husband have been digging into the area since the tick bite to make sure that they didn't leave any parts of the tick in the bite area. The patient now has evidence of a skin infection there. The vital signs are well within normal limits with exception of the blood pressure being elevated at 205/86. The patient has a history of hypertension. She did not take her blood pressure medication on yesterday, but did take it today.  The plan at this time is for the patient be placed on minocycline. The patient has been advised to have her blood pressure rechecked later this week. Patient is to return to the emergency department if any changes, problems, or concerns.    Final diagnoses:  None    **I have reviewed nursing notes, vital signs, and all appropriate lab and imaging results for this  patient.Kathie Dike*    Kitara Hebb M Jager Koska, PA-C 11/09/13 2305

## 2013-11-10 NOTE — ED Provider Notes (Signed)
Medical screening examination/treatment/procedure(s) were performed by non-physician practitioner and as supervising physician I was immediately available for consultation/collaboration.   EKG Interpretation None        Sanaai Doane L Primrose Oler, MD 11/10/13 1517 

## 2014-01-30 ENCOUNTER — Ambulatory Visit: Payer: BC Managed Care – PPO | Admitting: Family

## 2014-02-20 ENCOUNTER — Ambulatory Visit: Payer: BC Managed Care – PPO | Admitting: Family

## 2014-04-28 ENCOUNTER — Other Ambulatory Visit: Payer: Self-pay | Admitting: Family Medicine

## 2014-04-28 DIAGNOSIS — I10 Essential (primary) hypertension: Secondary | ICD-10-CM

## 2014-04-28 MED ORDER — LISINOPRIL-HYDROCHLOROTHIAZIDE 20-12.5 MG PO TABS: 1.0000 | ORAL_TABLET | Freq: Every day | ORAL | Status: DC

## 2014-04-28 NOTE — Telephone Encounter (Signed)
rx sent to pharmacy and several attempts have been made to contact patient

## 2014-06-06 ENCOUNTER — Ambulatory Visit: Payer: BC Managed Care – PPO | Admitting: Family Medicine

## 2014-06-19 ENCOUNTER — Ambulatory Visit (INDEPENDENT_AMBULATORY_CARE_PROVIDER_SITE_OTHER): Payer: 59 | Admitting: Family Medicine

## 2014-06-19 ENCOUNTER — Encounter (INDEPENDENT_AMBULATORY_CARE_PROVIDER_SITE_OTHER): Payer: Self-pay

## 2014-06-19 ENCOUNTER — Encounter: Payer: Self-pay | Admitting: Family Medicine

## 2014-06-19 VITALS — BP 195/95 | HR 91 | Temp 97.6°F | Ht 61.0 in | Wt 161.4 lb

## 2014-06-19 DIAGNOSIS — I10 Essential (primary) hypertension: Secondary | ICD-10-CM

## 2014-06-19 DIAGNOSIS — E785 Hyperlipidemia, unspecified: Secondary | ICD-10-CM

## 2014-06-19 MED ORDER — LISINOPRIL-HYDROCHLOROTHIAZIDE 20-12.5 MG PO TABS
1.0000 | ORAL_TABLET | Freq: Every day | ORAL | Status: DC
Start: 2014-06-19 — End: 2017-07-29

## 2014-06-19 NOTE — Patient Instructions (Addendum)
Continue current medications. Resume atorvastatin based on lab result. Continue good therapeutic lifestyle changes which include low salt diet and regular exercise. Please follow up in 2 weeks.

## 2014-06-19 NOTE — Progress Notes (Signed)
   Subjective:    Patient ID: Tiffany King, female    DOB: 1955-09-11, 59 y.o.   MRN: [id number]  HPI Pt is here today for follow up of hypertension. She ran out of her medication several days ago. She denies any adverse affects of currently elevated pressure, including HA, TIA, vision changes, chest pain and dyspnea. She was on a stronger medication in the past but it was changed due to a side effct. She thinks the name started with an "A". Sh           Review of Systems   Patient Active Problem List   Diagnosis Date Noted  . Hypertension 08/19/2013  . HLD (hyperlipidemia) 08/19/2013   Outpatient Encounter Prescriptions as of 06/19/2014  Medication Sig  . acetaminophen (TYLENOL) 500 MG tablet Take 500 mg by mouth every 6 (six) hours as needed. For pain  . fish oil-omega-3 fatty acids 1000 MG capsule Take 1 g by mouth 2 (two) times daily.  Marland Kitchen lisinopril-hydrochlorothiazide (PRINZIDE,ZESTORETIC) 20-12.5 MG per tablet Take 1 tablet by mouth daily.  . Multiple Vitamin (MULITIVITAMIN WITH MINERALS) TABS Take 1 tablet by mouth daily.  Marland Kitchen omeprazole (PRILOSEC) 20 MG capsule Take 20 mg by mouth daily. OTC  . atorvastatin (LIPITOR) 10 MG tablet Take 1 tablet (10 mg total) by mouth daily. (Patient not taking: Reported on 06/19/2014)  . [DISCONTINUED] minocycline (MINOCIN) 100 MG capsule Take 1 capsule (100 mg total) by mouth 2 (two) times daily. (Patient not taking: Reported on 06/19/2014)       Objective:   Physical Exam  Constitutional: She is oriented to person, place, and time. She appears well-developed and well-nourished. No distress.  HENT:  Head: Normocephalic and atraumatic.  Right Ear: External ear normal.  Left Ear: External ear normal.  Nose: Nose normal.  Mouth/Throat: Oropharynx is clear and moist.  Eyes: Conjunctivae and EOM are normal. Pupils are equal, round, and reactive to light.  Neck: Normal range of motion. Neck supple. No thyromegaly present.    Cardiovascular: Normal rate, regular rhythm and normal heart sounds.   No murmur heard. Pulmonary/Chest: Effort normal and breath sounds normal. No respiratory distress. She has no wheezes. She has no rales.  Abdominal: Soft. Bowel sounds are normal. She exhibits no distension. There is no tenderness.  Lymphadenopathy:    She has no cervical adenopathy.  Neurological: She is alert and oriented to person, place, and time. She has normal reflexes.  Skin: Skin is warm and dry.  Psychiatric: She has a normal mood and affect. Her behavior is normal. Judgment and thought content normal.   BP 195/95 mmHg  Pulse 91  Temp(Src) 97.6 F (36.4 C) (Oral)  Ht _0  (1.549 m)  Wt 161 lb 6.4 oz (73.211 kg)  BMI 30.51 kg/m2        Assessment & Plan:  1. Essential hypertension With accelerated level due to lack of medication - Lipid panel - CMP14+EGFR - lisinopril-hydrochlorothiazide (PRINZIDE,ZESTORETIC) 20-12.5 MG per tablet; Take 1 tablet by mouth daily. (Patient not taking: Reported on 06/19/2014)  Dispense: 90 tablet; Refill: 4

## 2014-06-20 LAB — LIPID PANEL
CHOL/HDL RATIO: 4.1 ratio (ref 0.0–4.4)
Cholesterol, Total: 215 mg/dL — ABNORMAL HIGH (ref 100–199)
HDL: 52 mg/dL (ref >39)
LDL Calculated: 126 mg/dL — ABNORMAL HIGH (ref 0–99)
TRIGLYCERIDES: 183 mg/dL — AB (ref 0–149)
VLDL Cholesterol Cal: 37 mg/dL (ref 5–40)

## 2014-06-20 LAB — CMP14+EGFR
A/G RATIO: 1.4 (ref 1.1–2.5)
ALT: 31 IU/L (ref 0–32)
AST: 25 IU/L (ref 0–40)
Albumin: 4.2 g/dL (ref 3.6–4.8)
Alkaline Phosphatase: 62 IU/L (ref 39–117)
BUN/Creatinine Ratio: 12 (ref 11–26)
BUN: 11 mg/dL (ref 8–27)
CALCIUM: 9.7 mg/dL (ref 8.7–10.3)
CHLORIDE: 103 mmol/L (ref 97–108)
CO2: 25 mmol/L (ref 18–29)
Creatinine, Ser: 0.93 mg/dL (ref 0.57–1.00)
GFR calc Af Amer: 77 mL/min/{1.73_m2} (ref >59)
GFR calc non Af Amer: 67 mL/min/{1.73_m2} (ref >59)
GLUCOSE: 83 mg/dL (ref 65–99)
Globulin, Total: 3.1 g/dL (ref 1.5–4.5)
POTASSIUM: 3.9 mmol/L (ref 3.5–5.2)
Sodium: 144 mmol/L (ref 134–144)
TOTAL PROTEIN: 7.3 g/dL (ref 6.0–8.5)
Total Bilirubin: 0.2 mg/dL (ref 0.0–1.2)

## 2014-06-23 ENCOUNTER — Telehealth: Payer: Self-pay | Admitting: Family Medicine

## 2014-06-23 NOTE — Telephone Encounter (Signed)
-----   Message from Mechele ClaudeWarren Stacks, MD sent at 06/22/2014  4:02 PM EST ----- Cholesterol is rather high. I would like for you to resume medication. Pravastatin scrip sent to pharmacy.

## 2014-06-29 NOTE — Telephone Encounter (Signed)
Patient aware.

## 2015-03-26 ENCOUNTER — Telehealth: Payer: Self-pay | Admitting: Family Medicine

## 2015-08-17 ENCOUNTER — Other Ambulatory Visit: Payer: Self-pay | Admitting: Family Medicine

## 2016-01-02 ENCOUNTER — Telehealth: Payer: Self-pay | Admitting: Family Medicine

## 2017-06-19 ENCOUNTER — Other Ambulatory Visit: Payer: Self-pay

## 2017-06-19 ENCOUNTER — Emergency Department (HOSPITAL_COMMUNITY)
Admission: EM | Admit: 2017-06-19 | Discharge: 2017-06-19 | Disposition: A | Discharge status: Home / Self Care | Payer: BLUE CROSS/BLUE SHIELD | Attending: Emergency Medicine | Admitting: Emergency Medicine

## 2017-06-19 ENCOUNTER — Encounter (HOSPITAL_COMMUNITY): Payer: Self-pay | Admitting: Emergency Medicine

## 2017-06-19 DIAGNOSIS — T7840XA Allergy, unspecified, initial encounter: Secondary | ICD-10-CM | POA: Diagnosis not present

## 2017-06-19 DIAGNOSIS — Z79899 Other long term (current) drug therapy: Secondary | ICD-10-CM | POA: Insufficient documentation

## 2017-06-19 DIAGNOSIS — E785 Hyperlipidemia, unspecified: Secondary | ICD-10-CM | POA: Diagnosis not present

## 2017-06-19 DIAGNOSIS — I10 Essential (primary) hypertension: Secondary | ICD-10-CM | POA: Insufficient documentation

## 2017-06-19 DIAGNOSIS — R21 Rash and other nonspecific skin eruption: Secondary | ICD-10-CM | POA: Diagnosis present

## 2017-06-19 MED ORDER — DIPHENHYDRAMINE HCL 25 MG PO CAPS
25.0000 mg | ORAL_CAPSULE | Freq: Once | ORAL | Status: AC
  Administered 2017-06-19: 25 mg via ORAL
  Filled 2017-06-19: qty 1

## 2017-06-19 MED ORDER — PREDNISONE 20 MG PO TABS: 40.0000 mg | ORAL_TABLET | Freq: Every day | ORAL | 0 refills | Status: DC

## 2017-06-19 MED ORDER — FAMOTIDINE 20 MG PO TABS: 20.0000 mg | ORAL_TABLET | Freq: Two times a day (BID) | ORAL | 0 refills | Status: AC

## 2017-06-19 MED ORDER — FAMOTIDINE 20 MG PO TABS
20.0000 mg | ORAL_TABLET | Freq: Once | ORAL | Status: AC
  Administered 2017-06-19: 20 mg via ORAL
  Filled 2017-06-19: qty 1

## 2017-06-19 MED ORDER — PREDNISONE 50 MG PO TABS
60.0000 mg | ORAL_TABLET | ORAL | Status: AC
  Administered 2017-06-19: 60 mg via ORAL
  Filled 2017-06-19: qty 1

## 2017-06-19 MED ORDER — DIPHENHYDRAMINE HCL 25 MG PO TABS: 25.0000 mg | ORAL_TABLET | Freq: Three times a day (TID) | ORAL | 0 refills | Status: DC

## 2017-06-19 NOTE — ED Provider Notes (Signed)
[facility] EMERGENCY DEPARTMENT Provider Note   CSN: [id number] Arrival date & time: 06/19/17  1626     History   Chief Complaint Chief Complaint  Patient presents with  . Rash    HPI Tiffany King is a 62 y.o. female.  HPI  Patient presents with concern of hives. Patient acknowledges multiple medical issues including hypertension, and allergies. She has known allergy to shellfish, and has had prior mild outbreaks with fish as well. Beginning 2 days ago, after eating a seafood restaurant she has noticed persistent hives on both arms. There is some sensation of mild chest tightness, but no true difficulty breathing, no difficulty speaking or swallowing. She has been taking Benadryl, but she has persistent skin changes on both arms. Otherwise no new fever, chills, or other changes from baseline.   Past Medical History:  Diagnosis Date  . Hyperlipidemia   . Hypertension     Patient Active Problem List   Diagnosis Date Noted  . Hypertension 08/19/2013  . HLD (hyperlipidemia) 08/19/2013    Past Surgical History:  Procedure Laterality Date  . TUBAL LIGATION      OB History    No data available       Home Medications    Prior to Admission medications   Medication Sig Start Date End Date Taking? Authorizing Provider  acetaminophen (TYLENOL) 500 MG tablet Take 500 mg by mouth every 6 (six) hours as needed. For pain    [provider]  atorvastatin (LIPITOR) 10 MG tablet Take 1 tablet (10 mg total) by mouth daily. Patient not taking: Reported on 06/19/2014 08/16/13   Coralie Keens, FNP  diphenhydrAMINE (BENADRYL) 25 MG tablet Take 1 tablet (25 mg total) by mouth 3 (three) times daily. Take one tablet three times daily for two days 06/19/17   [name], MD  famotidine (PEPCID) 20 MG tablet Take 1 tablet (20 mg total) by mouth 2 (two) times daily. Take one tablet twice daily for two days 06/19/17   [name], MD  fish oil-omega-3 fatty  acids 1000 MG capsule Take 1 g by mouth 2 (two) times daily.    [provider]  lisinopril-hydrochlorothiazide (PRINZIDE,ZESTORETIC) 20-12.5 MG per tablet Take 1 tablet by mouth daily. Patient not taking: Reported on 06/19/2014 06/19/14   Mechele Claude, MD  Multiple Vitamin (MULITIVITAMIN WITH MINERALS) TABS Take 1 tablet by mouth daily.    [provider]  omeprazole (PRILOSEC) 20 MG capsule Take 20 mg by mouth daily. OTC    [provider]  predniSONE (DELTASONE) 20 MG tablet Take 2 tablets (40 mg total) by mouth daily with breakfast. For the next four days 06/19/17   [name], MD    Family History Family History  Problem Relation Age of Onset  . Stroke Mother   . Heart disease Mother     Social History Social History   Tobacco Use  . Smoking status: Never Smoker  . Smokeless tobacco: Never Used  Substance Use Topics  . Alcohol use: No  . Drug use: No     Allergies   Bee venom and Shellfish allergy   Review of Systems Review of Systems  Constitutional:       Per HPI, otherwise negative  HENT:       Per HPI, otherwise negative  Respiratory:       Per HPI, otherwise negative  Cardiovascular:       Per HPI, otherwise negative  Gastrointestinal: Negative for vomiting.  Endocrine:  Negative aside from HPI  Genitourinary:       Neg aside from HPI   Musculoskeletal:       Per HPI, otherwise negative  Skin: Positive for rash.  Neurological: Negative for syncope.     Physical Exam Updated Vital Signs BP (!) 171/81 (BP Location: Right Arm)   Pulse 89   Temp 98 F (36.7 C) (Oral)   Resp 17   SpO2 100%   Physical Exam  Constitutional: She is oriented to person, place, and time. She appears well-developed and well-nourished. No distress.  HENT:  Head: Normocephalic and atraumatic.  Mouth/Throat: Oropharynx is clear and moist.  Eyes: Conjunctivae and EOM are normal.  Cardiovascular: Normal rate and regular rhythm.    Pulmonary/Chest: Effort normal and breath sounds normal. No stridor. No respiratory distress.  Abdominal: She exhibits no distension.  Musculoskeletal: She exhibits no edema.  Neurological: She is alert and oriented to person, place, and time. No cranial nerve deficit.  Skin: Skin is warm and dry. Rash noted.  Urticarial lesions both arms throughout with no confluent erythema  Psychiatric: She has a normal mood and affect.  Nursing note and vitals reviewed.    ED Treatments / Results   Procedures Procedures (including critical care time)  Medications Ordered in ED Medications  predniSONE (DELTASONE) tablet 60 mg (not administered)  famotidine (PEPCID) tablet 20 mg (not administered)  diphenhydrAMINE (BENADRYL) capsule 25 mg (not administered)     Initial Impression / Assessment and Plan / ED Course  I have reviewed the triage vital signs and the nursing notes.  Pertinent labs & imaging results that were available during my care of the patient were reviewed by me and considered in my medical decision making (see chart for details).  Well-appearing female presents due to bilateral hives. Patient is awake, alert, no evidence for oral pharyngeal compromise. Given passage of 2 days since onset, there is low suspicion for anaphylaxis, though there is evidence for allergic reaction. Patient will continue taking Benadryl, but will also receive Pepcid, steroids.  The patient will follow up with primary care.  Final Clinical Impressions(s) / ED Diagnoses   Final diagnoses:  Allergic reaction, initial encounter    ED Discharge Orders        Ordered    predniSONE (DELTASONE) 20 MG tablet  Daily with breakfast     06/19/17 1729    diphenhydrAMINE (BENADRYL) 25 MG tablet  3 times daily     06/19/17 1729    famotidine (PEPCID) 20 MG tablet  2 times daily     06/19/17 1729       Gerhard MunchLockwood, Wilder Kurowski, MD 06/19/17 1733

## 2017-06-19 NOTE — ED Triage Notes (Signed)
Rash to arms x 2 days. Taking benadryl and helps but comes back. Nad.

## 2017-06-19 NOTE — Discharge Instructions (Signed)
As discussed, your evaluation today has been largely reassuring.  But, it is important that you monitor your condition carefully, and do not hesitate to return to the ED if you develop new, or concerning changes in your condition. ? ?Otherwise, please follow-up with your physician for appropriate ongoing care. ? ?

## 2017-07-28 ENCOUNTER — Encounter (HOSPITAL_COMMUNITY): Payer: Self-pay | Admitting: *Deleted

## 2017-07-28 ENCOUNTER — Observation Stay (HOSPITAL_COMMUNITY)
Admission: EM | Admit: 2017-07-28 | Discharge: 2017-07-29 | Disposition: A | Discharge status: Home / Self Care | Payer: BLUE CROSS/BLUE SHIELD | Attending: Internal Medicine | Admitting: Internal Medicine

## 2017-07-28 ENCOUNTER — Observation Stay (HOSPITAL_COMMUNITY): Payer: BLUE CROSS/BLUE SHIELD

## 2017-07-28 ENCOUNTER — Emergency Department (HOSPITAL_COMMUNITY): Payer: BLUE CROSS/BLUE SHIELD

## 2017-07-28 ENCOUNTER — Other Ambulatory Visit: Payer: Self-pay

## 2017-07-28 DIAGNOSIS — Z79899 Other long term (current) drug therapy: Secondary | ICD-10-CM | POA: Insufficient documentation

## 2017-07-28 DIAGNOSIS — Z23 Encounter for immunization: Secondary | ICD-10-CM | POA: Diagnosis not present

## 2017-07-28 DIAGNOSIS — I6381 Other cerebral infarction due to occlusion or stenosis of small artery: Secondary | ICD-10-CM | POA: Diagnosis not present

## 2017-07-28 DIAGNOSIS — E785 Hyperlipidemia, unspecified: Secondary | ICD-10-CM | POA: Diagnosis not present

## 2017-07-28 DIAGNOSIS — I639 Cerebral infarction, unspecified: Principal | ICD-10-CM | POA: Insufficient documentation

## 2017-07-28 DIAGNOSIS — R200 Anesthesia of skin: Secondary | ICD-10-CM | POA: Insufficient documentation

## 2017-07-28 DIAGNOSIS — G459 Transient cerebral ischemic attack, unspecified: Secondary | ICD-10-CM | POA: Diagnosis not present

## 2017-07-28 DIAGNOSIS — R51 Headache: Secondary | ICD-10-CM | POA: Diagnosis present

## 2017-07-28 DIAGNOSIS — H9319 Tinnitus, unspecified ear: Secondary | ICD-10-CM | POA: Insufficient documentation

## 2017-07-28 DIAGNOSIS — I10 Essential (primary) hypertension: Secondary | ICD-10-CM

## 2017-07-28 LAB — COMPREHENSIVE METABOLIC PANEL
ALT: 30 U/L (ref 14–54)
AST: 30 U/L (ref 15–41)
Albumin: 4.4 g/dL (ref 3.5–5.0)
Alkaline Phosphatase: 56 U/L (ref 38–126)
Anion gap: 10 (ref 5–15)
BILIRUBIN TOTAL: 0.5 mg/dL (ref 0.3–1.2)
BUN: 14 mg/dL (ref 6–20)
CO2: 27 mmol/L (ref 22–32)
Calcium: 10.2 mg/dL (ref 8.9–10.3)
Chloride: 104 mmol/L (ref 101–111)
Creatinine, Ser: 0.98 mg/dL (ref 0.44–1.00)
Glucose, Bld: 119 mg/dL — ABNORMAL HIGH (ref 65–99)
Potassium: 3.4 mmol/L — ABNORMAL LOW (ref 3.5–5.1)
Sodium: 141 mmol/L (ref 135–145)
TOTAL PROTEIN: 8 g/dL (ref 6.5–8.1)

## 2017-07-28 LAB — LIPID PANEL
Cholesterol: 237 mg/dL — ABNORMAL HIGH (ref 0–200)
HDL: 59 mg/dL (ref >40)
LDL Cholesterol: 150 mg/dL — ABNORMAL HIGH (ref 0–99)
TRIGLYCERIDES: 138 mg/dL (ref <150)
Total CHOL/HDL Ratio: 4 RATIO
VLDL: 28 mg/dL (ref 0–40)

## 2017-07-28 LAB — DIFFERENTIAL
BASOS ABS: 0 10*3/uL (ref 0.0–0.1)
Basophils Relative: 0 %
EOS ABS: 0.2 10*3/uL (ref 0.0–0.7)
Eosinophils Relative: 4 %
LYMPHS ABS: 3.2 10*3/uL (ref 0.7–4.0)
Lymphocytes Relative: 51 %
MONOS PCT: 8 %
Monocytes Absolute: 0.5 10*3/uL (ref 0.1–1.0)
NEUTROS ABS: 2.3 10*3/uL (ref 1.7–7.7)
Neutrophils Relative %: 37 %

## 2017-07-28 LAB — I-STAT CHEM 8, ED
BUN: 13 mg/dL (ref 6–20)
CALCIUM ION: 1.26 mmol/L (ref 1.15–1.40)
Chloride: 102 mmol/L (ref 101–111)
Creatinine, Ser: 1 mg/dL (ref 0.44–1.00)
GLUCOSE: 116 mg/dL — AB (ref 65–99)
HCT: 41 % (ref 36.0–46.0)
HEMOGLOBIN: 13.9 g/dL (ref 12.0–15.0)
POTASSIUM: 3.3 mmol/L — AB (ref 3.5–5.1)
Sodium: 141 mmol/L (ref 135–145)
TCO2: 28 mmol/L (ref 22–32)

## 2017-07-28 LAB — CBC
HEMATOCRIT: 38.9 % (ref 36.0–46.0)
Hemoglobin: 12.8 g/dL (ref 12.0–15.0)
MCH: 28.1 pg (ref 26.0–34.0)
MCHC: 32.9 g/dL (ref 30.0–36.0)
MCV: 85.3 fL (ref 78.0–100.0)
Platelets: 292 10*3/uL (ref 150–400)
RBC: 4.56 MIL/uL (ref 3.87–5.11)
RDW: 13.4 % (ref 11.5–15.5)
WBC: 6.3 10*3/uL (ref 4.0–10.5)

## 2017-07-28 LAB — PROTIME-INR
INR: 0.97
Prothrombin Time: 12.8 seconds (ref 11.4–15.2)

## 2017-07-28 LAB — MAGNESIUM: MAGNESIUM: 1.8 mg/dL (ref 1.7–2.4)

## 2017-07-28 LAB — RAPID URINE DRUG SCREEN, HOSP PERFORMED
Amphetamines: NOT DETECTED
BARBITURATES: NOT DETECTED
Benzodiazepines: NOT DETECTED
Cocaine: NOT DETECTED
Opiates: NOT DETECTED
Tetrahydrocannabinol: NOT DETECTED

## 2017-07-28 LAB — I-STAT TROPONIN, ED: TROPONIN I, POC: 0 ng/mL (ref 0.00–0.08)

## 2017-07-28 LAB — URINALYSIS, ROUTINE W REFLEX MICROSCOPIC
Bilirubin Urine: NEGATIVE
Glucose, UA: NEGATIVE mg/dL
Hgb urine dipstick: NEGATIVE
Ketones, ur: NEGATIVE mg/dL
Leukocytes, UA: NEGATIVE
NITRITE: NEGATIVE
PROTEIN: NEGATIVE mg/dL
Specific Gravity, Urine: 1.004 — ABNORMAL LOW (ref 1.005–1.030)
pH: 8 (ref 5.0–8.0)

## 2017-07-28 LAB — ETHANOL

## 2017-07-28 LAB — HEMOGLOBIN A1C
Hgb A1c MFr Bld: 6.3 % — ABNORMAL HIGH (ref 4.8–5.6)
Mean Plasma Glucose: 134.11 mg/dL

## 2017-07-28 LAB — PHOSPHORUS: Phosphorus: 2.8 mg/dL (ref 2.5–4.6)

## 2017-07-28 LAB — CBG MONITORING, ED: GLUCOSE-CAPILLARY: 107 mg/dL — AB (ref 65–99)

## 2017-07-28 LAB — APTT: APTT: 29 s (ref 24–36)

## 2017-07-28 MED ORDER — ONDANSETRON HCL 4 MG/2ML IJ SOLN: 4.0000 mg | Freq: Four times a day (QID) | INTRAMUSCULAR | Status: DC | PRN

## 2017-07-28 MED ORDER — ENOXAPARIN SODIUM 40 MG/0.4ML ~~LOC~~ SOLN
40.0000 mg | SUBCUTANEOUS | Status: DC
  Administered 2017-07-28 – 2017-07-29 (×2): 40 mg via SUBCUTANEOUS
  Filled 2017-07-28: qty 0.4

## 2017-07-28 MED ORDER — PANTOPRAZOLE SODIUM 40 MG PO TBEC
40.0000 mg | DELAYED_RELEASE_TABLET | Freq: Every day | ORAL | Status: DC
  Administered 2017-07-28 – 2017-07-29 (×2): 40 mg via ORAL
  Filled 2017-07-28 (×3): qty 1

## 2017-07-28 MED ORDER — ATORVASTATIN CALCIUM 40 MG PO TABS
40.0000 mg | ORAL_TABLET | Freq: Every day | ORAL | Status: DC
  Administered 2017-07-28: 40 mg via ORAL
  Filled 2017-07-28 (×2): qty 1

## 2017-07-28 MED ORDER — LABETALOL HCL 5 MG/ML IV SOLN: 10.0000 mg | INTRAVENOUS | Status: DC | PRN

## 2017-07-28 MED ORDER — ONDANSETRON HCL 4 MG PO TABS: 4.0000 mg | ORAL_TABLET | Freq: Four times a day (QID) | ORAL | Status: DC | PRN

## 2017-07-28 MED ORDER — ASPIRIN 325 MG PO TABS
325.0000 mg | ORAL_TABLET | Freq: Every day | ORAL | Status: DC
  Administered 2017-07-28 – 2017-07-29 (×2): 325 mg via ORAL
  Filled 2017-07-28 (×3): qty 1

## 2017-07-28 MED ORDER — ACETAMINOPHEN 325 MG PO TABS: 650.0000 mg | ORAL_TABLET | Freq: Four times a day (QID) | ORAL | Status: DC | PRN

## 2017-07-28 MED ORDER — ENOXAPARIN SODIUM 40 MG/0.4ML ~~LOC~~ SOLN: 40.0000 mg | SUBCUTANEOUS | Status: DC

## 2017-07-28 MED ORDER — STROKE: EARLY STAGES OF RECOVERY BOOK
Freq: Once | Status: DC
  Filled 2017-07-28: qty 1

## 2017-07-28 MED ORDER — FAMOTIDINE 20 MG PO TABS: 20.0000 mg | ORAL_TABLET | Freq: Two times a day (BID) | ORAL | Status: DC

## 2017-07-28 MED ORDER — POTASSIUM CHLORIDE CRYS ER 20 MEQ PO TBCR
40.0000 meq | EXTENDED_RELEASE_TABLET | Freq: Once | ORAL | Status: AC
  Administered 2017-07-28: 40 meq via ORAL

## 2017-07-28 MED ORDER — ACETAMINOPHEN 650 MG RE SUPP: 650.0000 mg | Freq: Four times a day (QID) | RECTAL | Status: DC | PRN

## 2017-07-28 MED ORDER — DIPHENHYDRAMINE HCL 25 MG PO TABS: 25.0000 mg | ORAL_TABLET | Freq: Three times a day (TID) | ORAL | Status: DC

## 2017-07-28 MED ORDER — POTASSIUM CHLORIDE CRYS ER 20 MEQ PO TBCR
EXTENDED_RELEASE_TABLET | ORAL | Status: AC
  Filled 2017-07-28: qty 2

## 2017-07-28 MED ORDER — LABETALOL HCL 5 MG/ML IV SOLN
10.0000 mg | Freq: Once | INTRAVENOUS | Status: AC
  Administered 2017-07-28: 10 mg via INTRAVENOUS
  Filled 2017-07-28: qty 4

## 2017-07-28 MED ORDER — ENOXAPARIN SODIUM 40 MG/0.4ML ~~LOC~~ SOLN
SUBCUTANEOUS | Status: AC
  Filled 2017-07-28: qty 0.4

## 2017-07-28 MED ORDER — INFLUENZA VAC SPLIT QUAD 0.5 ML IM SUSY
0.5000 mL | PREFILLED_SYRINGE | INTRAMUSCULAR | Status: AC
  Administered 2017-07-29: 0.5 mL via INTRAMUSCULAR
  Filled 2017-07-28: qty 0.5

## 2017-07-28 MED ORDER — SENNOSIDES-DOCUSATE SODIUM 8.6-50 MG PO TABS
1.0000 | ORAL_TABLET | Freq: Every evening | ORAL | Status: DC | PRN
  Filled 2017-07-28: qty 1

## 2017-07-28 NOTE — Progress Notes (Signed)
Code stroke Beeper   3 am In room  304  Out   308 Soc   310 Rad   310

## 2017-07-28 NOTE — Progress Notes (Signed)
SLP Cancellation Note  Patient Details Name: Tiffany King MRN: [id number] DOB: 04/04/1956   Cancelled treatment:       Reason Eval/Treat Not Completed: SLP screened, no needs identified, will sign off; SLP screened Pt in room. Pt denies any changes in swallowing, speech, language, or cognition. SLE will be deferred at this time. Reconsult if indicated. SLP will sign off.  Thank you,  Havery MorosDabney Porter, CCC-SLP [phone]    PORTER,DABNEY 07/28/2017, 4:05 PM

## 2017-07-28 NOTE — ED Notes (Signed)
Patient transported to MRI 

## 2017-07-28 NOTE — Consult Note (Signed)
   [facility] TeleNeurology Consult Services  Impression: possible tia. Back to baseline at this time   Not a tpa candidate due to: resolved Not an NIR candidate due to: does not meet criteria    Comments:   [facility] contacted: [phone] [facility] at bedside: 321  Recommendations:  Admit stroke/telemetry Neuro checks dvt prophy Dysphagia screen Head of bed flat Iv fluids ns ASA if no contraindications  inpatient neurology consultation Inpatient stroke evaluation as per Neurology/ Internal Medicine Discussed with ED MD  -----------------------------------------------------------------------------------------  CC slurred speech  History of Present Illness   Patient is a  62 y.o. woman who comes to the hospital for an episode of left 4thand 5th digit tingling and slurred speech. This episode lasted about 3 minutes and it happed around 200am when she woke up to make some tea for the headache she was having. She feels backt o normal at this time. She does not take asa. No previous history of stroke or tia.  Diagnostic: Ct head without contrast: no acute findings per rad read.  Exam:  NIHSS score: 0  1A: Level of Consciousness - Alert; keenly responsive 1B: Ask Month and Age - Both Questions Right 1C: 'Blink Eyes' & 'Squeeze Hands' - Performs Both Tasks 2: Test Horizontal Extraocular Movements - Normal 3: Test Visual Fields - No Visual Loss 4: Test Facial Palsy - Normal symmetry 5A: Test Left Arm Motor Drift - No Drift for 10 Seconds 5B: Test Right Arm Motor Drift - No Drift for 10 Seconds 6A: Test Left Leg Motor Drift - No Drift for 5 Seconds 6B: Test Right Leg Motor Drift - No Drift for 5 Seconds 7: Test Limb Ataxia - No Ataxia 8: Test Sensation - Normal; No sensory loss 9: Test Language/Aphasia - Normal; No aphasia 10: Test Dysarthria - Normal 11: Test Extinction/Inattention - No abnormality      Medical Decision Making:  - Extensive number of  diagnosis or management options are considered above.   - Extensive amount of complex data reviewed.   - High risk of complication and/or morbidity or mortality are associated with differential diagnostic considerations above.  - There may be Uncertain outcome and increased probability of prolonged functional impairment or high probability of severe prolonged functional impairment associated with some of these differential diagnosis.  Medical Data Reviewed:  1.Data reviewed include clinical labs, radiology,  Medical Tests;   2.Tests results discussed w/performing or interpreting physician;   3.Obtaining/reviewing old medical records;  4.Obtaining case history from another source;  5.Independent review of image, tracing or specimen.    Patient was informed the Neurology Consult would happen via telehealth (remote video) and consented to receiving care in this manner.

## 2017-07-28 NOTE — ED Triage Notes (Signed)
Pt c/o ringing in her ears around 2030 and states she had a headache and numbness to her left hand with some difficulty getting her words out that was around 0230 this morning

## 2017-07-28 NOTE — ED Notes (Signed)
Patient transported to CT 

## 2017-07-28 NOTE — ED Notes (Signed)
[facility] paged @ [phone]

## 2017-07-28 NOTE — H&P (Signed)
History and Physical  Tiffany King ZOX:[id number] DOB: [birth year] DOA: 07/28/2017  Referring physician: Blinda Leatherwood MD  PCP: [facility]   Chief Complaint: Headache   HPI: Tiffany King is a 62 y.o. female with hypertension and hyperlipidemia who presented to the emergency department with back and left-sided numbness.  She also has been experiencing headache symptoms that started last evening.  She reports that the headache pain was initially severe but has progressively improved over the past several hours.  Early in the morning she noticed that there was some numbness in her left hand.  She has had a slight change in her speech patterns and has had some difficulty with speaking.  She reports that her speech has almost completely improved to normal.  She reports no loss of strength in her lower extremities.  She was seen in the emergency department today and admission was requested for further evaluation to rule out acute CVA.  Her CT brain did not show any acute findings.  She had an MRI of the brain done that did reveal 2 small acute lacunar infarcts.  She is being admitted for further evaluation and management.  She is also going to receive a neurology consultation.  Review of Systems: All systems reviewed and apart from history of presenting illness, are negative.  Past Medical History:  Diagnosis Date  . Hyperlipidemia   . Hypertension    Past Surgical History:  Procedure Laterality Date  . TUBAL LIGATION     Social History:  reports that  has never smoked. she has never used smokeless tobacco. She reports that she does not drink alcohol or use drugs.  Allergies  Allergen Reactions  . Bee Venom   . Shellfish Allergy     Family History  Problem Relation Age of Onset  . Stroke Mother   . Heart disease Mother     Prior to Admission medications   Medication Sig Start Date End Date Taking? Authorizing Provider  hydrochlorothiazide (HYDRODIURIL)  12.5 MG tablet Take 12.5 mg by mouth 2 (two) times daily.   Yes [provider]  verapamil (CALAN) 40 MG tablet Take 20 mg by mouth 2 (two) times daily.   Yes [provider]  acetaminophen (TYLENOL) 500 MG tablet Take 500 mg by mouth every 6 (six) hours as needed. For pain    [provider]  atorvastatin (LIPITOR) 10 MG tablet Take 1 tablet (10 mg total) by mouth daily. Patient not taking: Reported on 06/19/2014 08/16/13   Coralie Keens, FNP  diphenhydrAMINE (BENADRYL) 25 MG tablet Take 1 tablet (25 mg total) by mouth 3 (three) times daily. Take one tablet three times daily for two days 06/19/17   [name], MD  famotidine (PEPCID) 20 MG tablet Take 1 tablet (20 mg total) by mouth 2 (two) times daily. Take one tablet twice daily for two days 06/19/17   [name], MD  fish oil-omega-3 fatty acids 1000 MG capsule Take 1 g by mouth 2 (two) times daily.    [provider]  lisinopril-hydrochlorothiazide (PRINZIDE,ZESTORETIC) 20-12.5 MG per tablet Take 1 tablet by mouth daily. Patient not taking: Reported on 06/19/2014 06/19/14   Mechele Claude, MD  Multiple Vitamin (MULITIVITAMIN WITH MINERALS) TABS Take 1 tablet by mouth daily.    [provider]  omeprazole (PRILOSEC) 20 MG capsule Take 20 mg by mouth daily. OTC    [provider]  predniSONE (DELTASONE) 20 MG tablet Take 2 tablets (40 mg total)  by mouth daily with breakfast. For the next four days 06/19/17   Gerhard MunchLockwood, Robert, MD   Physical Exam: Vitals:   07/28/17 0510 07/28/17 0530 07/28/17 0645 07/28/17 0742  BP: (!) 177/99 (!) 182/102 (!) 177/99 (!) 182/90  Pulse: 94 96 96 (!) 101  Resp: 19 18 16 18   Temp:      TempSrc:      SpO2: 100% 99% 99% 99%  Weight:      Height:        General exam: Moderately built and nourished patient, lying comfortably supine on the gurney in no obvious distress.  Head, eyes and ENT: Nontraumatic and normocephalic. Pupils equally reacting to  light and accommodation. Oral mucosa moist.  Neck: Supple. No JVD, carotid bruit or thyromegaly.  Lymphatics: No lymphadenopathy.  Respiratory system: Clear to auscultation. No increased work of breathing.  Cardiovascular system: S1 and S2 heard, RRR. No JVD, murmurs, gallops, clicks or pedal edema.  Gastrointestinal system: Abdomen is nondistended, soft and nontender. Normal bowel sounds heard. No organomegaly or masses appreciated.  Central nervous system: Alert and oriented. She has slight slurred speech patterns.   Extremities: Symmetric 5 x 5 power. Peripheral pulses symmetrically felt.   Skin: No rashes or acute findings.  Musculoskeletal system: Negative exam.  Psychiatry: Pleasant and cooperative.  Labs on Admission:  Basic Metabolic Panel: Recent Labs  Lab 07/28/17 0302 07/28/17 0313  NA 141 141  K 3.4* 3.3*  CL 104 102  CO2 27  --   GLUCOSE 119* 116*  BUN 14 13  CREATININE 0.98 1.00  CALCIUM 10.2  --   MG 1.8  --   PHOS 2.8  --    Liver Function Tests: Recent Labs  Lab 07/28/17 0302  AST 30  ALT 30  ALKPHOS 56  BILITOT 0.5  PROT 8.0  ALBUMIN 4.4   No results for input(s): LIPASE, AMYLASE in the last 168 hours. No results for input(s): AMMONIA in the last 168 hours. CBC: Recent Labs  Lab 07/28/17 0302 07/28/17 0313  WBC 6.3  --   NEUTROABS 2.3  --   HGB 12.8 13.9  HCT 38.9 41.0  MCV 85.3  --   PLT 292  --    Cardiac Enzymes: No results for input(s): CKTOTAL, CKMB, CKMBINDEX, TROPONINI in the last 168 hours.  BNP (last 3 results) No results for input(s): PROBNP in the last 8760 hours. CBG: Recent Labs  Lab 07/28/17 0307  GLUCAP 107*    Radiological Exams on Admission: Dg Chest 2 View  Result Date: 07/28/2017 CLINICAL DATA:  TIA.  Nonproductive cough. EXAM: CHEST  2 VIEW COMPARISON:  05/27/2006 FINDINGS: The cardiomediastinal contours are normal. The lungs are clear. Pulmonary vasculature is normal. No consolidation, pleural  effusion, or pneumothorax. No acute osseous abnormalities are seen. IMPRESSION: No acute abnormality. Electronically Signed   By: Rubye OaksMelanie  Ehinger M.D.   On: 07/28/2017 05:30   Mr Brain Wo Contrast  Result Date: 07/28/2017 CLINICAL DATA:  62 year old female with acute onset tinnitus beginning at 2030 hr last night, subsequent headache, left hand numbness, and then abnormal speech beginning at 0230 hrs. EXAM: MRI HEAD WITHOUT CONTRAST MRA HEAD WITHOUT CONTRAST TECHNIQUE: Multiplanar, multiecho pulse sequences of the brain and surrounding structures were obtained without intravenous contrast. Angiographic images of the head were obtained using MRA technique without contrast. COMPARISON:  Head CT without contrast 0305 hr today. FINDINGS: MRI HEAD FINDINGS Brain: There is a small linear 7 millimeter focus of restricted diffusion in the  dorsal, inferior right lentiform nuclei near the posterior limbs of the right deep white matter capsules. See series 3, image 82 and series 4, image 27. Subtle T2 and FLAIR hyperintensity. No associated hemorrhage or mass effect. Additionally, there is a small subtle focus of restricted diffusion in the right parietal lobe on series 3, image 91 (series 5, image 42). No convincing T2 or FLAIR hyperintensity here. No contralateral left hemisphere or posterior fossa restricted diffusion. Normal cerebral volume. No midline shift, mass effect, evidence of mass lesion, ventriculomegaly, extra-axial collection or acute intracranial hemorrhage. Outside of the acute findings gray and white matter signal is normal for age throughout the brain. No chronic cerebral blood products or cortical encephalomalacia. Cervicomedullary junction and pituitary are within normal limits. Vascular: Major intracranial vascular flow voids are preserved, the distal left vertebral artery appears dominant. Skull and upper cervical spine: Negative visible cervical spine. Visualized bone marrow signal is within normal  limits. Sinuses/Orbits: Normal orbits soft tissues. Paranasal sinuses are clear. Other: Mastoid air cells are clear. Grossly normal visible internal auditory structures. Incidental right posterior scalp benign sebaceous cysts. MRA HEAD FINDINGS Intermittently degraded by motion artifact. Antegrade flow in the distal left vertebral artery which appears dominant and supplies the basilar. A diminutive distal right vertebral artery appears to terminated in PICA. The left PICA origin is patent. The basilar artery is patent. There is mild to moderate distal basilar stenosis, although this might be exaggerated by motion artifact. Fetal type bilateral PCA origins. The bilateral PCA branches appear symmetric. Antegrade flow in both ICA siphons. No definite siphon stenosis. Ophthalmic and posterior communicating artery origins appear to remain normal. Carotid termini, MCA and ACA origins are patent. MCA and ACA branch detail is degraded by motion. The MCA bifurcations are patent. Overall symmetric appearance of the bilateral anterior circulation branches. IMPRESSION: 1. Two small acute lacunar infarcts: In the posterior right lentiform near the posterior limbs of the deep white matter capsules, and in the right parietal lobe. No associated hemorrhage or mass effect. 2. Otherwise normal for age noncontrast MRI appearance of the brain. 3. Intracranial MRA is degraded by motion. No emergent large vessel occlusion. Evidence of atherosclerosis and up to moderate stenosis in the distal basilar artery. Electronically Signed   By: Odessa Fleming M.D.   On: 07/28/2017 07:36   US Carotid Bilateral (at [facility] Only)  Result Date: 07/28/2017 CLINICAL DATA:  TIA.  Slurred speech. EXAM: BILATERAL CAROTID DUPLEX ULTRASOUND TECHNIQUE: Wallace Cullens scale imaging, color Doppler and duplex ultrasound were performed of bilateral carotid and vertebral arteries in the neck. COMPARISON:  Brain MRI 07/28/2017 FINDINGS: Criteria: Quantification of carotid  stenosis is based on velocity parameters that correlate the residual internal carotid diameter with NASCET-based stenosis levels, using the diameter of the distal internal carotid lumen as the denominator for stenosis measurement. The following velocity measurements were obtained: RIGHT ICA:  79 cm/sec CCA:  78 cm/sec SYSTOLIC ICA/CCA RATIO:  1.0 DIASTOLIC ICA/CCA RATIO:  2.0 ECA:  115 cm/sec LEFT ICA:  68 cm/sec CCA:  73 cm/sec SYSTOLIC ICA/CCA RATIO:  0.9 DIASTOLIC ICA/CCA RATIO:  0.8 ECA:  93 cm/sec RIGHT CAROTID ARTERY: Intimal thickening at the carotid bulb. External carotid artery is patent with normal waveform. Normal waveforms and velocities in the internal carotid artery. RIGHT VERTEBRAL ARTERY: Antegrade flow and normal waveform in the right vertebral artery. LEFT CAROTID ARTERY: Left carotid arteries are patent without significant plaque or stenosis. External carotid artery is patent with normal waveform. Normal waveforms  and velocities in the internal carotid artery. LEFT VERTEBRAL ARTERY: Antegrade flow and normal waveform in the left vertebral artery. IMPRESSION: Normal carotid artery duplex. No significant plaque or stenosis in the carotid arteries. Patent vertebral arteries with antegrade flow. Electronically Signed   By: Richarda Overlie M.D.   On: 07/28/2017 07:59   Mr Maxine Glenn Head/brain ZO Cm  Result Date: 07/28/2017 CLINICAL DATA:  62 year old female with acute onset tinnitus beginning at 2030 hr last night, subsequent headache, left hand numbness, and then abnormal speech beginning at 0230 hrs. EXAM: MRI HEAD WITHOUT CONTRAST MRA HEAD WITHOUT CONTRAST TECHNIQUE: Multiplanar, multiecho pulse sequences of the brain and surrounding structures were obtained without intravenous contrast. Angiographic images of the head were obtained using MRA technique without contrast. COMPARISON:  Head CT without contrast 0305 hr today. FINDINGS: MRI HEAD FINDINGS Brain: There is a small linear 7 millimeter focus of  restricted diffusion in the dorsal, inferior right lentiform nuclei near the posterior limbs of the right deep white matter capsules. See series 3, image 82 and series 4, image 27. Subtle T2 and FLAIR hyperintensity. No associated hemorrhage or mass effect. Additionally, there is a small subtle focus of restricted diffusion in the right parietal lobe on series 3, image 91 (series 5, image 42). No convincing T2 or FLAIR hyperintensity here. No contralateral left hemisphere or posterior fossa restricted diffusion. Normal cerebral volume. No midline shift, mass effect, evidence of mass lesion, ventriculomegaly, extra-axial collection or acute intracranial hemorrhage. Outside of the acute findings gray and white matter signal is normal for age throughout the brain. No chronic cerebral blood products or cortical encephalomalacia. Cervicomedullary junction and pituitary are within normal limits. Vascular: Major intracranial vascular flow voids are preserved, the distal left vertebral artery appears dominant. Skull and upper cervical spine: Negative visible cervical spine. Visualized bone marrow signal is within normal limits. Sinuses/Orbits: Normal orbits soft tissues. Paranasal sinuses are clear. Other: Mastoid air cells are clear. Grossly normal visible internal auditory structures. Incidental right posterior scalp benign sebaceous cysts. MRA HEAD FINDINGS Intermittently degraded by motion artifact. Antegrade flow in the distal left vertebral artery which appears dominant and supplies the basilar. A diminutive distal right vertebral artery appears to terminated in PICA. The left PICA origin is patent. The basilar artery is patent. There is mild to moderate distal basilar stenosis, although this might be exaggerated by motion artifact. Fetal type bilateral PCA origins. The bilateral PCA branches appear symmetric. Antegrade flow in both ICA siphons. No definite siphon stenosis. Ophthalmic and posterior communicating  artery origins appear to remain normal. Carotid termini, MCA and ACA origins are patent. MCA and ACA branch detail is degraded by motion. The MCA bifurcations are patent. Overall symmetric appearance of the bilateral anterior circulation branches. IMPRESSION: 1. Two small acute lacunar infarcts: In the posterior right lentiform near the posterior limbs of the deep white matter capsules, and in the right parietal lobe. No associated hemorrhage or mass effect. 2. Otherwise normal for age noncontrast MRI appearance of the brain. 3. Intracranial MRA is degraded by motion. No emergent large vessel occlusion. Evidence of atherosclerosis and up to moderate stenosis in the distal basilar artery. Electronically Signed   By: Odessa Fleming M.D.   On: 07/28/2017 07:36   Ct Head Code Stroke Wo Contrast  Result Date: 07/28/2017 CLINICAL DATA:  Code stroke. Left-sided weakness and difficulty speaking EXAM: CT HEAD WITHOUT CONTRAST TECHNIQUE: Contiguous axial images were obtained from the base of the skull through the vertex without intravenous contrast.  COMPARISON:  None. FINDINGS: Brain: No mass lesion or acute hemorrhage. No focal hypoattenuation of the basal ganglia or cortex to indicate infarcted tissue. No hydrocephalus or age advanced atrophy. Vascular: No hyperdense vessel. No advanced atherosclerotic calcification of the arteries at the skull base. Skull: Normal visualized skull base, calvarium and extracranial soft tissues. Sinuses/Orbits: No sinus fluid levels or advanced mucosal thickening. No mastoid effusion. Normal orbits. ASPECTS [facility] Stroke Program Early CT Score) - Ganglionic level infarction (caudate, lentiform nuclei, internal capsule, insula, M1-M3 cortex): 7 - Supraganglionic infarction (M4-M6 cortex): 3 Total score (0-10 with 10 being normal): 10 IMPRESSION: 1. No hemorrhage or mass effect. 2. ASPECTS is 10. These results were called by telephone at the time of interpretation on 07/28/2017 at 3:22 am to Dr.  Jaci Carrel , who verbally acknowledged these results. Electronically Signed   By: Deatra Robinson M.D.   On: 07/28/2017 03:22   EKG: Independently reviewed.  Normal sinus rhythm  Assessment/Plan Principal Problem:   TIA (transient ischemic attack) Active Problems:   Hypertension   HLD (hyperlipidemia)  1. Acute lucunar infarcts - Admit for continuous telemetry monitoring, echocardiogram, carotid Doppler studies, check fasting lipid panel, A1c, obtain PT/OT evaluations and speech therapy evaluation.  Consult neurologist for inpatient evaluation and management recommendations.  Continue neurochecks.  Permissive hypertension.  Aspirin has been ordered for antiplatelet therapy. 2. Essential hypertension - allowing permissive hypertension in setting of acute CVA.   3. Dyslipidemia - check fasting lipid panel. Starting atorvastatin.   DVT Prophylaxis: enoxaparin Code Status: Full   Family Communication: bedside  Disposition Plan: TBD   Time spent: 58 mins  Standley Dakins, MD [facility] Pager [phone]  If 7PM-7AM, please contact night-coverage [email or web address] Password [email or web address] 07/28/2017, 8:25 AM

## 2017-07-28 NOTE — Evaluation (Signed)
Physical Therapy Evaluation Patient Details Name: Tiffany King MRN: [id number] DOB: 10/08/1955 Today's Date: 07/28/2017   History of Present Illness  Patient is a 62 y/o female who presents to the emergency department for evaluation of back and left-sided numbness.  Patient reports that she has been experiencing a headache and ringing in the ears that started at 8:30 PM.  Patient reports that the pain was fairly severe earlier but it is easing off now.  At 2:30 AM she noticed that her left hand was numb and she was having difficulty speaking.  Numbness is improving her speech is closer to baseline now.  It    Clinical Impression  Patient functioning near baseline for functional mobility and gait with no deficits from TIA.  Plan:  Patient discharged from physical therapy to care of nursing.    Follow Up Recommendations No PT follow up    Equipment Recommendations  None recommended by PT    Recommendations for Other Services       Precautions / Restrictions Precautions Precautions: None Restrictions Weight Bearing Restrictions: No      Mobility  Bed Mobility Overal bed mobility: Independent                Transfers Overall transfer level: Independent                  Ambulation/Gait Ambulation/Gait assistance: Independent Ambulation Distance (Feet): 120 Feet Assistive device: None Gait Pattern/deviations: WFL(Within Functional Limits)        Stairs            Wheelchair Mobility    Modified Rankin (Stroke Patients Only)       Balance Overall balance assessment: No apparent balance deficits (not formally assessed)                                           Pertinent Vitals/Pain Pain Assessment: 0-10 Pain Score: 2  Pain Location: chronic left shoulder pain Pain Descriptors / Indicators: Aching Pain Intervention(s): Limited activity within patient's tolerance;Monitored during session    Home Living Family/patient  expects to be discharged to:: Private residence Living Arrangements: Spouse/significant other;Children Available Help at Discharge: Family Type of Home: House Home Access: Stairs to enter Entrance Stairs-Rails: Right;Left;Can reach both Entrance Stairs-Number of Steps: 4-5 steps in front, 3 steps in back entrance Home Layout: Multi-level Home Equipment: Cane - single point      Prior Function Level of Independence: Independent               Hand Dominance   Dominant Hand: Right    Extremity/Trunk Assessment   Upper Extremity Assessment Upper Extremity Assessment: Overall WFL for tasks assessed    Lower Extremity Assessment Lower Extremity Assessment: Overall WFL for tasks assessed    Cervical / Trunk Assessment Cervical / Trunk Assessment: Normal  Communication   Communication: No difficulties  Cognition Arousal/Alertness: Awake/alert Behavior During Therapy: WFL for tasks assessed/performed Overall Cognitive Status: Within Functional Limits for tasks assessed                                        General Comments      Exercises     Assessment/Plan    PT Assessment Patent does not need any further PT services  PT Problem List  PT Treatment Interventions      PT Goals (Current goals can be found in the Care Plan section)  Acute Rehab PT Goals Patient Stated Goal: return home PT Goal Formulation: With patient/family Time For Goal Achievement: 22-Aug-2017 Potential to Achieve Goals: Good    Frequency     Barriers to discharge        Co-evaluation               AM-PAC PT "6 Clicks" Daily Activity  Outcome Measure Difficulty turning over in bed (including adjusting bedclothes, sheets and blankets)?: None Difficulty moving from lying on back to sitting on the side of the bed? : None Difficulty sitting down on and standing up from a chair with arms (e.g., wheelchair, bedside commode, etc,.)?: None Help needed moving to  and from a bed to chair (including a wheelchair)?: None Help needed walking in hospital room?: None Help needed climbing 3-5 steps with a railing? : None 6 Click Score: 24    End of Session   Activity Tolerance: Patient tolerated treatment well Patient left: in bed;with call bell/phone within reach;with family/visitor present Nurse Communication: Mobility status PT Visit Diagnosis: Unsteadiness on feet (R26.81);Other abnormalities of gait and mobility (R26.89);Muscle weakness (generalized) (M62.81)    Time: 1610-9604 PT Time Calculation (min) (ACUTE ONLY): 21 min   Charges:   PT Evaluation $PT Eval Low Complexity: 1 Low PT Treatments $Therapeutic Activity: 8-22 mins   PT G Codes:        2:30 PM, 08-22-17 Ocie Bob, MPT Physical Therapist with [facility] [phone] office [phone] mobile phone

## 2017-07-28 NOTE — Consult Note (Signed)
[place] A. Merlene Laughter, MD     [email or web address]          Tiffany King is an 62 y.o. female.   ASSESSMENT/PLAN: 1. Acute lacunar infarcts: Risk factor hypertension, dyslipidemia and age. I agree with the increased dose of the patient's Lipitor. Aspirin 325 mg is recommended. Continue with blood pressure control.      This is a 62 year old ambidextrous black female who presents with the acute onset of dysarthria, numbness of the left hand and weakness of the left hand. She reports that since hospitalized her symptoms have improved. She also had some headaches with these symptoms but denies other symptoms such as chest pain, shortness of breath, GI GU symptoms. The review systems otherwise negative. She has not considered for a thrombolysis because of the time of presentation.    GENERAL: This a pleasant female in no acute distress.  HEENT: Supple without trauma  ABDOMEN: soft  EXTREMITIES: No edema   BACK: Normal  SKIN: Normal by inspection.    MENTAL STATUS: Alert and oriented. Speech, language and cognition are generally intact. Judgment and insight normal.   CRANIAL NERVES: Pupils are equal, round and reactive to light and accomodation; extra ocular movements are full, there is no significant nystagmus; visual fields are full; upper and lower facial muscles are normal in strength and symmetric, there is no flattening of the nasolabial folds; tongue is midline; uvula is midline; shoulder elevation is normal.  MOTOR: Normal tone, bulk and strength; mild pronator drift involving the left upper extremity. However, there is no downward drift. There is subtle impairment of fine finger movements and dexterity involving the left hand. There is no downward drift of the extremities.  COORDINATION: Left finger to nose is normal, right finger to nose is normal, No rest tremor; no intention tremor; no postural tremor; no bradykinesia.  REFLEXES: Deep tendon  reflexes are symmetrical but brisk in the upper lower extremities. Plantar reflexes are flexor bilaterally.   SENSATION: Normal to light touch, temperature, and pain.      Blood pressure (!) 171/80, pulse (!) 101, temperature 97.8 F (36.6 C), temperature source Oral, resp. rate 16, height 5' 1.5" (1.562 m), weight 162 lb 11.2 oz (73.8 kg), SpO2 100 %.  Past Medical History:  Diagnosis Date  . Hyperlipidemia   . Hypertension     Past Surgical History:  Procedure Laterality Date  . TUBAL LIGATION      Family History  Problem Relation Age of Onset  . Stroke Mother   . Heart disease Mother     Social History:  reports that  has never smoked. she has never used smokeless tobacco. She reports that she does not drink alcohol or use drugs.  Allergies:  Allergies  Allergen Reactions  . Bee Venom   . Shellfish Allergy     Medications: Prior to Admission medications   Medication Sig Start Date End Date Taking? Authorizing Provider  acetaminophen (TYLENOL) 500 MG tablet Take 500 mg by mouth every 6 (six) hours as needed. For pain   Yes [provider]  Ascorbic Acid (VITAMIN C PO) Take 1 tablet by mouth daily.   Yes [provider]  atorvastatin (LIPITOR) 10 MG tablet Take 1 tablet (10 mg total) by mouth daily. 08/16/13  Yes Haliburton, Mae E, FNP  carbamide peroxide (DEBROX) 6.5 % OTIC solution Place 5 drops into the right ear.   Yes [provider]  diphenhydrAMINE (BENADRYL) 25 MG tablet Take 1 tablet (25  mg total) by mouth 3 (three) times daily. Take one tablet three times daily for two days 06/19/17  Yes Carmin Muskrat, MD  famotidine (PEPCID) 20 MG tablet Take 1 tablet (20 mg total) by mouth 2 (two) times daily. Take one tablet twice daily for two days 06/19/17  Yes Carmin Muskrat, MD  fish oil-omega-3 fatty acids 1000 MG capsule Take 1 g by mouth 2 (two) times daily.   Yes [provider]  lisinopril-hydrochlorothiazide  (PRINZIDE,ZESTORETIC) 20-12.5 MG per tablet Take 1 tablet by mouth daily. 06/19/14  Yes Claretta Fraise, MD  loratadine (CLARITIN) 10 MG tablet Take 10 mg by mouth daily as needed for allergies.   Yes [provider]  Multiple Vitamin (MULITIVITAMIN WITH MINERALS) TABS Take 1 tablet by mouth daily.   Yes [provider]  Pyridoxine HCl (VITAMIN B6 PO) Take 1 tablet by mouth daily.   Yes [provider]  verapamil (CALAN) 40 MG tablet Take 20 mg by mouth 2 (two) times daily.   Yes [provider]    Scheduled Meds: .  stroke: mapping our early stages of recovery book   Does not apply Once  . aspirin  325 mg Oral Daily  . atorvastatin  40 mg Oral q1800  . enoxaparin (LOVENOX) injection  40 mg Subcutaneous Q24H  . [START ON 07/29/2017] Influenza vac split quadrivalent PF  0.5 mL Intramuscular Tomorrow-1000  . pantoprazole  40 mg Oral Daily   Continuous Infusions: PRN Meds:.acetaminophen **OR** acetaminophen, labetalol, ondansetron **OR** ondansetron (ZOFRAN) IV, senna-docusate     Results for orders placed or performed during the hospital encounter of 07/28/17 (from the past 48 hour(s))  Ethanol     Status: None   Collection Time: 07/28/17  3:02 AM  Result Value Ref Range   Alcohol, Ethyl (B) <10 <10 mg/dL    Comment:        LOWEST DETECTABLE LIMIT FOR SERUM ALCOHOL IS 10 mg/dL FOR MEDICAL PURPOSES ONLY Performed at [facility], [address] [place], [place] [address]   Protime-INR     Status: None   Collection Time: 07/28/17  3:02 AM  Result Value Ref Range   Prothrombin Time 12.8 11.4 - 15.2 seconds   INR 0.97     Comment: Performed at [facility], [address] [place], [place] [address]  APTT     Status: None   Collection Time: 07/28/17  3:02 AM  Result Value Ref Range   aPTT 29 24 - 36 seconds    Comment: Performed at [facility], [address] [place], [place] [address]  CBC     Status: None   Collection Time: 07/28/17  3:02 AM    Result Value Ref Range   WBC 6.3 4.0 - 10.5 K/uL   RBC 4.56 3.87 - 5.11 MIL/uL   Hemoglobin 12.8 12.0 - 15.0 g/dL   HCT 38.9 36.0 - 46.0 %   MCV 85.3 78.0 - 100.0 fL   MCH 28.1 26.0 - 34.0 pg   MCHC 32.9 30.0 - 36.0 g/dL   RDW 13.4 11.5 - 15.5 %   Platelets 292 150 - 400 K/uL    Comment: Performed at [facility], [address] [place], [place] [address]  Differential     Status: None   Collection Time: 07/28/17  3:02 AM  Result Value Ref Range   Neutrophils Relative % 37 %   Neutro Abs 2.3 1.7 - 7.7 K/uL   Lymphocytes Relative 51 %   Lymphs Abs  3.2 0.7 - 4.0 K/uL   Monocytes Relative 8 %   Monocytes Absolute 0.5 0.1 - 1.0 K/uL   Eosinophils Relative 4 %   Eosinophils Absolute 0.2 0.0 - 0.7 K/uL   Basophils Relative 0 %   Basophils Absolute 0.0 0.0 - 0.1 K/uL    Comment: Performed at [facility], [address], [place], [place] [address]  Comprehensive metabolic panel     Status: Abnormal   Collection Time: 07/28/17  3:02 AM  Result Value Ref Range   Sodium 141 135 - 145 mmol/L   Potassium 3.4 (L) 3.5 - 5.1 mmol/L   Chloride 104 101 - 111 mmol/L   CO2 27 22 - 32 mmol/L   Glucose, Bld 119 (H) 65 - 99 mg/dL   BUN 14 6 - 20 mg/dL   Creatinine, Ser 0.98 0.44 - 1.00 mg/dL   Calcium 10.2 8.9 - 10.3 mg/dL   Total Protein 8.0 6.5 - 8.1 g/dL   Albumin 4.4 3.5 - 5.0 g/dL   AST 30 15 - 41 U/L   ALT 30 14 - 54 U/L   Alkaline Phosphatase 56 38 - 126 U/L   Total Bilirubin 0.5 0.3 - 1.2 mg/dL   GFR calc non Af Amer >60 >60 mL/min   GFR calc Af Amer >60 >60 mL/min    Comment: (NOTE) The eGFR has been calculated using the CKD EPI equation. This calculation has not been validated in all clinical situations. eGFR's persistently <60 mL/min signify possible Chronic Kidney Disease.    Anion gap 10 5 - 15    Comment: Performed at [facility], [address], [place], [place] [address]  Magnesium     Status: None   Collection Time: 07/28/17  3:02 AM  Result Value Ref Range    Magnesium 1.8 1.7 - 2.4 mg/dL    Comment: Performed at [facility], [address] [place], [place] [address]  Phosphorus     Status: None   Collection Time: 07/28/17  3:02 AM  Result Value Ref Range   Phosphorus 2.8 2.5 - 4.6 mg/dL    Comment: Performed at [facility], [address] [place], [place] [address]  Hemoglobin A1c     Status: Abnormal   Collection Time: 07/28/17  3:02 AM  Result Value Ref Range   Hgb A1c MFr Bld 6.3 (H) 4.8 - 5.6 %    Comment: (NOTE) Pre diabetes:          5.7%-6.4% Diabetes:              >6.4% Glycemic control for   <7.0% adults with diabetes    Mean Plasma Glucose 134.11 mg/dL    Comment: Performed at [facility] [address] [place], [place] [address]  Urine rapid drug screen (hosp performed)     Status: None   Collection Time: 07/28/17  3:06 AM  Result Value Ref Range   Opiates NONE DETECTED NONE DETECTED   Cocaine NONE DETECTED NONE DETECTED   Benzodiazepines NONE DETECTED NONE DETECTED   Amphetamines NONE DETECTED NONE DETECTED   Tetrahydrocannabinol NONE DETECTED NONE DETECTED   Barbiturates NONE DETECTED NONE DETECTED    Comment: (NOTE) DRUG SCREEN FOR MEDICAL PURPOSES ONLY.  IF CONFIRMATION IS NEEDED FOR ANY PURPOSE, NOTIFY LAB WITHIN 5 DAYS. LOWEST DETECTABLE LIMITS FOR URINE DRUG SCREEN Drug Class                     Cutoff (ng/mL) Amphetamine and metabolites  1000 Barbiturate and metabolites    200 Benzodiazepine                 993 Tricyclics and metabolites     300 Opiates and metabolites        300 Cocaine and metabolites        300 THC                            50 Performed at [facility], [address], [place], [place] [address]   Urinalysis, Routine w reflex microscopic     Status: Abnormal   Collection Time: 07/28/17  3:06 AM  Result Value Ref Range   Color, Urine COLORLESS (A) YELLOW   APPearance CLEAR CLEAR   Specific Gravity, Urine 1.004 (L) 1.005 - 1.030   pH 8.0 5.0 - 8.0   Glucose, UA NEGATIVE  NEGATIVE mg/dL   Hgb urine dipstick NEGATIVE NEGATIVE   Bilirubin Urine NEGATIVE NEGATIVE   Ketones, ur NEGATIVE NEGATIVE mg/dL   Protein, ur NEGATIVE NEGATIVE mg/dL   Nitrite NEGATIVE NEGATIVE   Leukocytes, UA NEGATIVE NEGATIVE    Comment: Performed at [facility], [address] [place], [place] [address]  CBG monitoring, ED     Status: Abnormal   Collection Time: 07/28/17  3:07 AM  Result Value Ref Range   Glucose-Capillary 107 (H) 65 - 99 mg/dL  I-stat troponin, ED     Status: None   Collection Time: 07/28/17  3:12 AM  Result Value Ref Range   Troponin i, poc 0.00 0.00 - 0.08 ng/mL   Comment 3            Comment: Due to the release kinetics of cTnI, a negative result within the first hours of the onset of symptoms does not rule out myocardial infarction with certainty. If myocardial infarction is still suspected, repeat the test at appropriate intervals.   I-Stat Chem 8, ED     Status: Abnormal   Collection Time: 07/28/17  3:13 AM  Result Value Ref Range   Sodium 141 135 - 145 mmol/L   Potassium 3.3 (L) 3.5 - 5.1 mmol/L   Chloride 102 101 - 111 mmol/L   BUN 13 6 - 20 mg/dL   Creatinine, Ser 1.00 0.44 - 1.00 mg/dL   Glucose, Bld 116 (H) 65 - 99 mg/dL   Calcium, Ion 1.26 1.15 - 1.40 mmol/L   TCO2 28 22 - 32 mmol/L   Hemoglobin 13.9 12.0 - 15.0 g/dL   HCT 41.0 36.0 - 46.0 %  Lipid panel     Status: Abnormal   Collection Time: 07/28/17  6:18 AM  Result Value Ref Range   Cholesterol 237 (H) 0 - 200 mg/dL   Triglycerides 138 <150 mg/dL   HDL 59 >40 mg/dL   Total CHOL/HDL Ratio 4.0 RATIO   VLDL 28 0 - 40 mg/dL   LDL Cholesterol 150 (H) 0 - 99 mg/dL    Comment:        Total Cholesterol/HDL:CHD Risk Coronary Heart Disease Risk Table                     Men   Women  1/2 Average Risk   3.4   3.3  Average Risk       5.0   4.4  2 X Average Risk   9.6   7.1  3 X Average Risk  23.4   11.0  Use the calculated Patient Ratio above and the CHD Risk Table to determine  the patient's CHD Risk.        ATP III CLASSIFICATION (LDL):  <100     mg/dL   Optimal  100-129  mg/dL   Near or Above                    Optimal  130-159  mg/dL   Borderline  160-189  mg/dL   High  >190     mg/dL   Very High Performed at [place] [place], [place] [address]     Studies/Results: CAROTID DOPPLERS NORMAL   BRAIN MRI MRA EXAM: MRI HEAD WITHOUT CONTRAST  MRA HEAD WITHOUT CONTRAST  TECHNIQUE: Multiplanar, multiecho pulse sequences of the brain and surrounding structures were obtained without intravenous contrast. Angiographic images of the head were obtained using MRA technique without contrast.  COMPARISON:  Head CT without contrast 0305 hr today.  FINDINGS: MRI HEAD FINDINGS  Brain: There is a small linear 7 millimeter focus of restricted diffusion in the dorsal, inferior right lentiform nuclei near the posterior limbs of the right deep white matter capsules. See series 3, image 82 and series 4, image 27. Subtle T2 and FLAIR hyperintensity. No associated hemorrhage or mass effect.  Additionally, there is a small subtle focus of restricted diffusion in the right parietal lobe on series 3, image 91 (series 5, image 42). No convincing T2 or FLAIR hyperintensity here.  No contralateral left hemisphere or posterior fossa restricted diffusion. Normal cerebral volume. No midline shift, mass effect, evidence of mass lesion, ventriculomegaly, extra-axial collection or acute intracranial hemorrhage. Outside of the acute findings gray and white matter signal is normal for age throughout the brain. No chronic cerebral blood products or cortical encephalomalacia. Cervicomedullary junction and pituitary are within normal limits.  Vascular: Major intracranial vascular flow voids are preserved, the distal left vertebral artery appears dominant.  Skull and upper cervical spine: Negative visible cervical spine. Visualized bone marrow  signal is within normal limits.  Sinuses/Orbits: Normal orbits soft tissues. Paranasal sinuses are clear.  Other: Mastoid air cells are clear. Grossly normal visible internal auditory structures. Incidental right posterior scalp benign sebaceous cysts.  MRA HEAD FINDINGS  Intermittently degraded by motion artifact.  Antegrade flow in the distal left vertebral artery which appears dominant and supplies the basilar. A diminutive distal right vertebral artery appears to terminated in PICA. The left PICA origin is patent. The basilar artery is patent. There is mild to moderate distal basilar stenosis, although this might be exaggerated by motion artifact. Fetal type bilateral PCA origins. The bilateral PCA branches appear symmetric.  Antegrade flow in both ICA siphons. No definite siphon stenosis. Ophthalmic and posterior communicating artery origins appear to remain normal. Carotid termini, MCA and ACA origins are patent. MCA and ACA branch detail is degraded by motion. The MCA bifurcations are patent. Overall symmetric appearance of the bilateral anterior circulation branches.  IMPRESSION: 1. Two small acute lacunar infarcts: In the posterior right lentiform near the posterior limbs of the deep white matter capsules, and in the right parietal lobe. No associated hemorrhage or mass effect. 2. Otherwise normal for age noncontrast MRI appearance of the brain. 3. Intracranial MRA is degraded by motion. No emergent large vessel occlusion. Evidence of atherosclerosis and up to moderate stenosis in the distal basilar artery.   The brain MRI and MRA are reviewed in person.  There is a tiny infarct consistent with acute  infarct involving the posterior limb of the internal capsule on the right side.  There is also a tiny a increased signal seen on DWI involving the lateral aspect of the occipital lobe.  There is minimal white matter disease seen on FLAIR imaging.  MRA shows no  large vessel occlusive disease although there is luminal irregularity a involving the distal M4 segment.     Rocket Gunderson A. Merlene Laughter, M.D.  Diplomate, Tax adviser of Psychiatry and Neurology ( Neurology). 07/28/2017, 6:28 PM

## 2017-07-28 NOTE — ED Notes (Signed)
Patient transported to X-ray 

## 2017-07-28 NOTE — ED Provider Notes (Signed)
[facility] EMERGENCY DEPARTMENT Provider Note   CSN: [id number] Arrival date & time: 07/28/17  1191   An emergency department physician performed an initial assessment on this suspected stroke patient at 0309.  History   Chief Complaint Chief Complaint  Patient presents with  . Headache    HPI Tiffany King is a 62 y.o. female.  Patient presents to the emergency department for evaluation of back and left-sided numbness.  Patient reports that she has been experiencing a headache and ringing in the ears that started at 8:30 PM.  Patient reports that the pain was fairly severe earlier but it is easing off now.  At 2:30 AM she noticed that her left hand was numb and she was having difficulty speaking.  Numbness is improving her speech is closer to baseline now.  It      Past Medical History:  Diagnosis Date  . Hyperlipidemia   . Hypertension     Patient Active Problem List   Diagnosis Date Noted  . Hypertension 08/19/2013  . HLD (hyperlipidemia) 08/19/2013    Past Surgical History:  Procedure Laterality Date  . TUBAL LIGATION      OB History    No data available       Home Medications    Prior to Admission medications   Medication Sig Start Date End Date Taking? Authorizing Provider  hydrochlorothiazide (HYDRODIURIL) 12.5 MG tablet Take 12.5 mg by mouth 2 (two) times daily.   Yes [provider]  verapamil (CALAN) 40 MG tablet Take 20 mg by mouth 2 (two) times daily.   Yes [provider]  acetaminophen (TYLENOL) 500 MG tablet Take 500 mg by mouth every 6 (six) hours as needed. For pain    [provider]  atorvastatin (LIPITOR) 10 MG tablet Take 1 tablet (10 mg total) by mouth daily. Patient not taking: Reported on 06/19/2014 08/16/13   Coralie Keens, FNP  diphenhydrAMINE (BENADRYL) 25 MG tablet Take 1 tablet (25 mg total) by mouth 3 (three) times daily. Take one tablet three times daily for two days 06/19/17   [name],  MD  famotidine (PEPCID) 20 MG tablet Take 1 tablet (20 mg total) by mouth 2 (two) times daily. Take one tablet twice daily for two days 06/19/17   [name], MD  fish oil-omega-3 fatty acids 1000 MG capsule Take 1 g by mouth 2 (two) times daily.    [provider]  lisinopril-hydrochlorothiazide (PRINZIDE,ZESTORETIC) 20-12.5 MG per tablet Take 1 tablet by mouth daily. Patient not taking: Reported on 06/19/2014 06/19/14   Mechele Claude, MD  Multiple Vitamin (MULITIVITAMIN WITH MINERALS) TABS Take 1 tablet by mouth daily.    [provider]  omeprazole (PRILOSEC) 20 MG capsule Take 20 mg by mouth daily. OTC    [provider]  predniSONE (DELTASONE) 20 MG tablet Take 2 tablets (40 mg total) by mouth daily with breakfast. For the next four days 06/19/17   [name], MD    Family History Family History  Problem Relation Age of Onset  . Stroke Mother   . Heart disease Mother     Social History Social History   Tobacco Use  . Smoking status: Never Smoker  . Smokeless tobacco: Never Used  Substance Use Topics  . Alcohol use: No  . Drug use: No     Allergies   Bee venom and Shellfish allergy   Review of Systems Review of Systems  Neurological: Positive for numbness and headaches.  All  other systems reviewed and are negative.    Physical Exam Updated Vital Signs BP (!) 201/103   Pulse (!) 102   Temp 97.7 F (36.5 C)   Resp 15   Ht 5' 1.5" (1.562 m)   Wt 72.6 kg (160 lb)   SpO2 99%   BMI 29.74 kg/m   Physical Exam  Constitutional: She is oriented to person, place, and time. She appears well-developed and well-nourished. No distress.  HENT:  Head: Normocephalic and atraumatic.  Right Ear: Hearing normal.  Left Ear: Hearing normal.  Nose: Nose normal.  Mouth/Throat: Oropharynx is clear and moist and mucous membranes are normal.  Eyes: Conjunctivae and EOM are normal. Pupils are equal, round, and reactive to light.  Neck: Normal  range of motion. Neck supple.  Cardiovascular: Regular rhythm, S1 normal and S2 normal. Exam reveals no gallop and no friction rub.  No murmur heard. Pulmonary/Chest: Effort normal and breath sounds normal. No respiratory distress. She exhibits no tenderness.  Abdominal: Soft. Normal appearance and bowel sounds are normal. There is no hepatosplenomegaly. There is no tenderness. There is no rebound, no guarding, no tenderness at McBurney's point and negative Murphy's sign. No hernia.  Musculoskeletal: Normal range of motion.  Neurological: She is alert and oriented to person, place, and time. She has normal strength. No cranial nerve deficit or sensory deficit. Coordination normal. GCS eye subscore is 4. GCS verbal subscore is 5. GCS motor subscore is 6.  Skin: Skin is warm, dry and intact. No rash noted. No cyanosis.  Psychiatric: She has a normal mood and affect. Her speech is normal and behavior is normal. Thought content normal.  Nursing note and vitals reviewed.    ED Treatments / Results  Labs (all labs ordered are listed, but only abnormal results are displayed) Labs Reviewed  COMPREHENSIVE METABOLIC PANEL - Abnormal; Notable for the following components:      Result Value   Potassium 3.4 (*)    Glucose, Bld 119 (*)    All other components within normal limits  I-STAT CHEM 8, ED - Abnormal; Notable for the following components:   Potassium 3.3 (*)    Glucose, Bld 116 (*)    All other components within normal limits  CBG MONITORING, ED - Abnormal; Notable for the following components:   Glucose-Capillary 107 (*)    All other components within normal limits  ETHANOL  PROTIME-INR  APTT  CBC  DIFFERENTIAL  RAPID URINE DRUG SCREEN, HOSP PERFORMED  URINALYSIS, ROUTINE W REFLEX MICROSCOPIC  I-STAT TROPONIN, ED    EKG  EKG Interpretation  Date/Time:  Tuesday July 28 2017 03:08:21 EST Ventricular Rate:  104 PR Interval:    QRS Duration: 85 QT Interval:  375 QTC  Calculation: 494 R Axis:   62 Text Interpretation:  Sinus tachycardia Consider left atrial enlargement Borderline T wave abnormalities Borderline prolonged QT interval Confirmed by Gilda Crease [phone]) on 07/28/2017 3:10:21 AM       Radiology Ct Head Code Stroke Wo Contrast  Result Date: 07/28/2017 CLINICAL DATA:  Code stroke. Left-sided weakness and difficulty speaking EXAM: CT HEAD WITHOUT CONTRAST TECHNIQUE: Contiguous axial images were obtained from the base of the skull through the vertex without intravenous contrast. COMPARISON:  None. FINDINGS: Brain: No mass lesion or acute hemorrhage. No focal hypoattenuation of the basal ganglia or cortex to indicate infarcted tissue. No hydrocephalus or age advanced atrophy. Vascular: No hyperdense vessel. No advanced atherosclerotic calcification of the arteries at the skull base. Skull: Normal  visualized skull base, calvarium and extracranial soft tissues. Sinuses/Orbits: No sinus fluid levels or advanced mucosal thickening. No mastoid effusion. Normal orbits. ASPECTS [facility]([place] Stroke Program Early CT Score) - Ganglionic level infarction (caudate, lentiform nuclei, internal capsule, insula, M1-M3 cortex): 7 - Supraganglionic infarction (M4-M6 cortex): 3 Total score (0-10 with 10 being normal): 10 IMPRESSION: 1. No hemorrhage or mass effect. 2. ASPECTS is 10. These results were called by telephone at the time of interpretation on 07/28/2017 at 3:22 am to Dr. Jaci CarrelHRISTOPHER Cobain Morici , who verbally acknowledged these results. Electronically Signed   By: Deatra RobinsonKevin  Herman M.D.   On: 07/28/2017 03:22    Procedures Procedures (including critical care time)  Medications Ordered in ED Medications - No data to display   Initial Impression / Assessment and Plan / ED Course  I have reviewed the triage vital signs and the nursing notes.  Pertinent labs & imaging results that were available during my care of the patient were reviewed by me and considered in my  medical decision making (see chart for details).     Presents with headache of 7 hours duration followed by onset of left-sided numbness and difficulty with speech 1 hour before arrival in it she does not have any measurable deficits on examination.  Head CT negative.  Patient initiated into the code stroke pathway at time of arrival.  Neurology has evaluated her and recommends hospitalization for TIA workup.  Final Clinical Impressions(s) / ED Diagnoses   Final diagnoses:  TIA (transient ischemic attack)    ED Discharge Orders    None       Helena Sardo, Canary Brimhristopher J, MD 07/28/17 [id number]

## 2017-07-29 ENCOUNTER — Observation Stay (HOSPITAL_BASED_OUTPATIENT_CLINIC_OR_DEPARTMENT_OTHER): Payer: BLUE CROSS/BLUE SHIELD

## 2017-07-29 ENCOUNTER — Other Ambulatory Visit: Payer: Self-pay

## 2017-07-29 ENCOUNTER — Other Ambulatory Visit (HOSPITAL_COMMUNITY): Payer: PRIVATE HEALTH INSURANCE

## 2017-07-29 DIAGNOSIS — G459 Transient cerebral ischemic attack, unspecified: Secondary | ICD-10-CM

## 2017-07-29 DIAGNOSIS — E785 Hyperlipidemia, unspecified: Secondary | ICD-10-CM | POA: Diagnosis not present

## 2017-07-29 DIAGNOSIS — I10 Essential (primary) hypertension: Secondary | ICD-10-CM

## 2017-07-29 DIAGNOSIS — I639 Cerebral infarction, unspecified: Secondary | ICD-10-CM | POA: Diagnosis present

## 2017-07-29 LAB — ECHOCARDIOGRAM COMPLETE
AVLVOTPG: 4 mmHg
CHL CUP MV DEC (S): 203
CHL CUP STROKE VOLUME: 32 mL
E decel time: 203 msec
E/e' ratio: 9
FS: 34 % (ref 28–44)
HEIGHTINCHES: 61.5 in
IVS/LV PW RATIO, ED: 1.08
LA ID, A-P, ES: 29 mm
LADIAMINDEX: 1.6 cm/m2
LAVOL: 44.2 mL
LAVOLA4C: 49.5 mL
LAVOLIN: 24.3 mL/m2
LDCA: 2.84 cm2
LEFT ATRIUM END SYS DIAM: 29 mm
LV E/e'average: 9
LV PW d: 10.6 mm — AB (ref 0.6–1.1)
LV SIMPSON'S DISK: 61
LV TDI E'MEDIAL: 5.87
LV e' LATERAL: 6.09 cm/s
LV sys vol index: 11 mL/m2
LV sys vol: 20 mL
LVDIAVOL: 52 mL (ref 46–106)
LVDIAVOLIN: 29 mL/m2
LVEEMED: 9
LVOT SV: 60 mL
LVOT VTI: 21.1 cm
LVOT diameter: 19 mm
LVOT peak vel: 106 cm/s
Lateral S' vel: 12.1 cm/s
MV pk A vel: 94.6 m/s
MVPKEVEL: 54.8 m/s
TAPSE: 22.4 mm
TDI e' lateral: 6.09
WEIGHTICAEL: 2603.19 [oz_av]

## 2017-07-29 LAB — COMPREHENSIVE METABOLIC PANEL
ALT: 30 U/L (ref 14–54)
AST: 30 U/L (ref 15–41)
Albumin: 4.3 g/dL (ref 3.5–5.0)
Alkaline Phosphatase: 54 U/L (ref 38–126)
Anion gap: 12 (ref 5–15)
BUN: 17 mg/dL (ref 6–20)
CO2: 24 mmol/L (ref 22–32)
CREATININE: 1.02 mg/dL — AB (ref 0.44–1.00)
Calcium: 10.2 mg/dL (ref 8.9–10.3)
Chloride: 102 mmol/L (ref 101–111)
GFR, EST NON AFRICAN AMERICAN: 57 mL/min — AB (ref >60)
Glucose, Bld: 113 mg/dL — ABNORMAL HIGH (ref 65–99)
POTASSIUM: 3.5 mmol/L (ref 3.5–5.1)
Sodium: 138 mmol/L (ref 135–145)
Total Bilirubin: 0.8 mg/dL (ref 0.3–1.2)
Total Protein: 8 g/dL (ref 6.5–8.1)

## 2017-07-29 LAB — CBC
HCT: 41.4 % (ref 36.0–46.0)
Hemoglobin: 13.1 g/dL (ref 12.0–15.0)
MCH: 27.5 pg (ref 26.0–34.0)
MCHC: 31.6 g/dL (ref 30.0–36.0)
MCV: 86.8 fL (ref 78.0–100.0)
Platelets: 321 10*3/uL (ref 150–400)
RBC: 4.77 MIL/uL (ref 3.87–5.11)
RDW: 13.9 % (ref 11.5–15.5)
WBC: 6.8 10*3/uL (ref 4.0–10.5)

## 2017-07-29 MED ORDER — LISINOPRIL 10 MG PO TABS
10.0000 mg | ORAL_TABLET | Freq: Every day | ORAL | Status: DC
  Administered 2017-07-29: 10 mg via ORAL
  Filled 2017-07-29: qty 1

## 2017-07-29 MED ORDER — LISINOPRIL-HYDROCHLOROTHIAZIDE 20-12.5 MG PO TABS: 1.0000 | ORAL_TABLET | Freq: Every day | ORAL | 0 refills | Status: DC

## 2017-07-29 MED ORDER — POTASSIUM CHLORIDE CRYS ER 20 MEQ PO TBCR
40.0000 meq | EXTENDED_RELEASE_TABLET | Freq: Once | ORAL | Status: AC
  Administered 2017-07-29: 40 meq via ORAL
  Filled 2017-07-29: qty 2

## 2017-07-29 MED ORDER — PANTOPRAZOLE SODIUM 40 MG PO TBEC: 40.0000 mg | DELAYED_RELEASE_TABLET | Freq: Every day | ORAL | 0 refills | Status: DC

## 2017-07-29 MED ORDER — ATORVASTATIN CALCIUM 40 MG PO TABS: 40.0000 mg | ORAL_TABLET | Freq: Every day | ORAL | 0 refills | Status: AC

## 2017-07-29 MED ORDER — ASPIRIN 325 MG PO TABS: 325.0000 mg | ORAL_TABLET | Freq: Every day | ORAL | Status: AC

## 2017-07-29 MED ORDER — ATORVASTATIN CALCIUM 10 MG PO TABS: 40.0000 mg | ORAL_TABLET | Freq: Every day | ORAL | 0 refills | Status: DC

## 2017-07-29 NOTE — Discharge Summary (Addendum)
Physician Discharge Summary  Tiffany King ZOX:[id number] DOB: [birth year] DOA: 07/28/2017  PCP: Randell Patient [facility]  Admit date: 07/28/2017 Discharge date: 07/29/2017  Time spent: 60 minutes  Recommendations for Outpatient Follow-up:  1. Follow-up with Dr. Gerilyn Pilgrim in 3 weeks for follow-up on acute CVA. 2. Follow-up with Health, [facility] in 2 weeks.  On follow-up patient will need a basic metabolic profile done to follow-up on electrolytes and renal function.  Patient will need risk factor modification with further management of blood pressure  And hyperlipidemia.   Discharge Diagnoses:  Principal Problem:   CVA (cerebral vascular accident) [facility]) Active Problems:   Hypertension   HLD (hyperlipidemia)   Discharge Condition: Stable and improved  Diet recommendation: Heart healthy  Filed Weights   07/28/17 0254 07/28/17 1339  Weight: 72.6 kg (160 lb) 73.8 kg (162 lb 11.2 oz)    History of present illness:  Per Dr. Fortunato Curling Tiffany King is a 62 y.o. female with hypertension and hyperlipidemia who presented to the emergency department with back and left-sided numbness.  She also has been experiencing headache symptoms that started last evening prior to admission.  She reported that the headache pain was initially severe but had progressively improved over the past several hours.  Early in the morning she noticed that there was some numbness in her left hand.  She has had a slight change in her speech pattern and had some difficulty with speaking.  She reported that her speech was almost completely improved to normal.  She reported no loss of strength in her lower extremities.  She was seen in the emergency department on day of admission, and admission was requested for further evaluation to rule out acute CVA.  Her CT brain did not show any acute findings.  She had an MRI of the brain done that did reveal 2 small acute lacunar infarcts.  She was being  admitted for further evaluation and management.  She was also going to receive a neurology consultation.    Hospital Course:  #1 acute lacunar infarct/CVA Patient had presented with some speech difficulties and left hand numbness that improved during this hospitalization.  Patient was admitted placed on telemetry and stroke workup undertaken.  CT head which was done on admission was negative for any acute abnormalities.  MRI of the brain which was done did reveal 2 small acute lacunar infarcts.  MRA of the head which was done was negative for any large vessel occlusion.  Due to delay in presentation patient was not considered for thrombolysis.  Fasting lipid panel which was done did have a total cholesterol of 237, LDL of 150.  Patient's Lipitor dose was increased to 40 mg from 10 mg daily.  Patient underwent carotid Dopplers which were negative for any significant ICA stenosis.  2D echo which was done did have a EF of 55-60%, no wall motion abnormalities, grade 1 diastolic dysfunction, no clots or thrombus noted.  Patient was seen by PT/OT.  Patient was started on full dose aspirin 325 mg daily.  Neurology consultation was obtained and patient was seen in consultation by Dr. Gerilyn Pilgrim on 07/28/2017.  As recommended per neurology that patient continue on aspirin 325 mg daily.  Neurology was also in agreement with increasing patient's Lipitor from 10 mg to 40 mg daily.  As well as blood pressure control.  Permissive hypertension was initially allowed during initial hospitalization and patient was subsequently started on half a home dose of lisinopril at 10 mg  daily.  Patient be discharged back home on her home regimen of lisinopril HCTZ as well as verapamil.  Patient will follow-up with PCP and neurology in the outpatient setting.  2.  Hypertension Permissive hypertension was allowed in the setting of acute CVA on presentation and patient's antihypertensive medications were held.  Patient was placed back on  lisinopril 10 mg daily on day of discharge can be discharged home back on her home regimen of lisinopril HCTZ and verapamil which is to be started 07/30/2017.  Outpatient follow-up with PCP.  3.  Hyperlipidemia Fasting lipid panel obtained had a total cholesterol 237.  LDL of 150.  Patient's home regimen of statin dose was the increased from 10 mg daily to 40 mg daily of Lipitor.  Patient will need outpatient follow-up with PCP.  The rest of patient's chronic medical issues remained stable throughout the hospitalization and patient was discharged in stable and improved condition.  Procedures:  MRI brain/MRA brain 07/28/2017  Chest x-ray 07/28/2017  CT head 07/28/2017  Carotid Dopplers 07/28/2017  2D echo 07/29/2017  Consultations:  Neurology: Dr. Beryle BeamsKofi Doonquah 07/28/2017  Discharge Exam: Vitals:   07/28/17 2230 07/29/17 0330  BP: (!) 163/77 (!) 163/77  Pulse: 84 85  Resp: 18 18  Temp: 97.7 F (36.5 C) 97.8 F (36.6 C)  SpO2: 99% 100%    General: NAD Cardiovascular: RRR Respiratory: CTAB  Discharge Instructions   Discharge Instructions    Diet - low sodium heart healthy   Complete by:  As directed    Increase activity slowly   Complete by:  As directed      Allergies as of 07/29/2017      Reactions   Bee Venom    Shellfish Allergy       Medication List    STOP taking these medications   diphenhydrAMINE 25 MG tablet Commonly known as:  BENADRYL   omeprazole 20 MG capsule Commonly known as:  PRILOSEC Replaced by:  pantoprazole 40 MG tablet     TAKE these medications   acetaminophen 500 MG tablet Commonly known as:  TYLENOL Take 500 mg by mouth every 6 (six) hours as needed. For pain   aspirin 325 MG tablet Take 1 tablet (325 mg total) by mouth daily. Start taking on:  07/30/2017   atorvastatin 40 MG tablet Commonly known as:  LIPITOR Take 1 tablet (40 mg total) by mouth daily at 6 PM. What changed:    medication strength  how much to take  when  to take this   carbamide peroxide 6.5 % OTIC solution Commonly known as:  DEBROX Place 5 drops into the right ear.   CLARITIN 10 MG tablet Generic drug:  loratadine Take 10 mg by mouth daily as needed for allergies.   famotidine 20 MG tablet Commonly known as:  PEPCID Take 1 tablet (20 mg total) by mouth 2 (two) times daily. Take one tablet twice daily for two days   fish oil-omega-3 fatty acids 1000 MG capsule Take 1 g by mouth 2 (two) times daily.   lisinopril-hydrochlorothiazide 20-12.5 MG tablet Commonly known as:  PRINZIDE,ZESTORETIC Take 1 tablet by mouth daily. Start taking on:  07/30/2017   multivitamin with minerals Tabs tablet Take 1 tablet by mouth daily.   pantoprazole 40 MG tablet Commonly known as:  PROTONIX Take 1 tablet (40 mg total) by mouth daily. Start taking on:  07/30/2017 Replaces:  omeprazole 20 MG capsule   verapamil 40 MG tablet Commonly known as:  CALAN Take  20 mg by mouth 2 (two) times daily.   VITAMIN B6 PO Take 1 tablet by mouth daily.   VITAMIN C PO Take 1 tablet by mouth daily.      Allergies  Allergen Reactions  . Bee Venom   . Shellfish Allergy    Follow-up Information    Beryle Beams, MD. Schedule an appointment as soon as possible for a visit in 3 week(s).   Specialty:  Neurology Why:  F/U IN 3-4 WEEKS. Contact information: [address] [place] [address] [phone]        [facility], [facility]. Schedule an appointment as soon as possible for a visit in 2 week(s).   Why:  F/U IN 1-2 WEEKS. Contact information: [address] [place] [address] [phone]            The results of significant diagnostics from this hospitalization (including imaging, microbiology, ancillary and laboratory) are listed below for reference.    Significant Diagnostic Studies: Dg Chest 2 View  Result Date: 07/28/2017 CLINICAL DATA:  TIA.  Nonproductive cough. EXAM: CHEST  2 VIEW COMPARISON:  05/27/2006  FINDINGS: The cardiomediastinal contours are normal. The lungs are clear. Pulmonary vasculature is normal. No consolidation, pleural effusion, or pneumothorax. No acute osseous abnormalities are seen. IMPRESSION: No acute abnormality. Electronically Signed   By: Rubye Oaks M.D.   On: 07/28/2017 05:30   Mr Brain Wo Contrast  Result Date: 07/28/2017 CLINICAL DATA:  62 year old female with acute onset tinnitus beginning at 2030 hr last night, subsequent headache, left hand numbness, and then abnormal speech beginning at 0230 hrs. EXAM: MRI HEAD WITHOUT CONTRAST MRA HEAD WITHOUT CONTRAST TECHNIQUE: Multiplanar, multiecho pulse sequences of the brain and surrounding structures were obtained without intravenous contrast. Angiographic images of the head were obtained using MRA technique without contrast. COMPARISON:  Head CT without contrast 0305 hr today. FINDINGS: MRI HEAD FINDINGS Brain: There is a small linear 7 millimeter focus of restricted diffusion in the dorsal, inferior right lentiform nuclei near the posterior limbs of the right deep white matter capsules. See series 3, image 82 and series 4, image 27. Subtle T2 and FLAIR hyperintensity. No associated hemorrhage or mass effect. Additionally, there is a small subtle focus of restricted diffusion in the right parietal lobe on series 3, image 91 (series 5, image 42). No convincing T2 or FLAIR hyperintensity here. No contralateral left hemisphere or posterior fossa restricted diffusion. Normal cerebral volume. No midline shift, mass effect, evidence of mass lesion, ventriculomegaly, extra-axial collection or acute intracranial hemorrhage. Outside of the acute findings gray and white matter signal is normal for age throughout the brain. No chronic cerebral blood products or cortical encephalomalacia. Cervicomedullary junction and pituitary are within normal limits. Vascular: Major intracranial vascular flow voids are preserved, the distal left vertebral  artery appears dominant. Skull and upper cervical spine: Negative visible cervical spine. Visualized bone marrow signal is within normal limits. Sinuses/Orbits: Normal orbits soft tissues. Paranasal sinuses are clear. Other: Mastoid air cells are clear. Grossly normal visible internal auditory structures. Incidental right posterior scalp benign sebaceous cysts. MRA HEAD FINDINGS Intermittently degraded by motion artifact. Antegrade flow in the distal left vertebral artery which appears dominant and supplies the basilar. A diminutive distal right vertebral artery appears to terminated in PICA. The left PICA origin is patent. The basilar artery is patent. There is mild to moderate distal basilar stenosis, although this might be exaggerated by motion artifact. Fetal type bilateral PCA origins. The bilateral PCA  branches appear symmetric. Antegrade flow in both ICA siphons. No definite siphon stenosis. Ophthalmic and posterior communicating artery origins appear to remain normal. Carotid termini, MCA and ACA origins are patent. MCA and ACA branch detail is degraded by motion. The MCA bifurcations are patent. Overall symmetric appearance of the bilateral anterior circulation branches. IMPRESSION: 1. Two small acute lacunar infarcts: In the posterior right lentiform near the posterior limbs of the deep white matter capsules, and in the right parietal lobe. No associated hemorrhage or mass effect. 2. Otherwise normal for age noncontrast MRI appearance of the brain. 3. Intracranial MRA is degraded by motion. No emergent large vessel occlusion. Evidence of atherosclerosis and up to moderate stenosis in the distal basilar artery. Electronically Signed   By: Odessa Fleming M.D.   On: 07/28/2017 07:36   US Carotid Bilateral (at [facility] Only)  Result Date: 07/28/2017 CLINICAL DATA:  TIA.  Slurred speech. EXAM: BILATERAL CAROTID DUPLEX ULTRASOUND TECHNIQUE: Wallace Cullens scale imaging, color Doppler and duplex ultrasound were performed  of bilateral carotid and vertebral arteries in the neck. COMPARISON:  Brain MRI 07/28/2017 FINDINGS: Criteria: Quantification of carotid stenosis is based on velocity parameters that correlate the residual internal carotid diameter with NASCET-based stenosis levels, using the diameter of the distal internal carotid lumen as the denominator for stenosis measurement. The following velocity measurements were obtained: RIGHT ICA:  79 cm/sec CCA:  78 cm/sec SYSTOLIC ICA/CCA RATIO:  1.0 DIASTOLIC ICA/CCA RATIO:  2.0 ECA:  115 cm/sec LEFT ICA:  68 cm/sec CCA:  73 cm/sec SYSTOLIC ICA/CCA RATIO:  0.9 DIASTOLIC ICA/CCA RATIO:  0.8 ECA:  93 cm/sec RIGHT CAROTID ARTERY: Intimal thickening at the carotid bulb. External carotid artery is patent with normal waveform. Normal waveforms and velocities in the internal carotid artery. RIGHT VERTEBRAL ARTERY: Antegrade flow and normal waveform in the right vertebral artery. LEFT CAROTID ARTERY: Left carotid arteries are patent without significant plaque or stenosis. External carotid artery is patent with normal waveform. Normal waveforms and velocities in the internal carotid artery. LEFT VERTEBRAL ARTERY: Antegrade flow and normal waveform in the left vertebral artery. IMPRESSION: Normal carotid artery duplex. No significant plaque or stenosis in the carotid arteries. Patent vertebral arteries with antegrade flow. Electronically Signed   By: Richarda Overlie M.D.   On: 07/28/2017 07:59   Mr Maxine Glenn Head/brain WU Cm  Result Date: 07/28/2017 CLINICAL DATA:  62 year old female with acute onset tinnitus beginning at 2030 hr last night, subsequent headache, left hand numbness, and then abnormal speech beginning at 0230 hrs. EXAM: MRI HEAD WITHOUT CONTRAST MRA HEAD WITHOUT CONTRAST TECHNIQUE: Multiplanar, multiecho pulse sequences of the brain and surrounding structures were obtained without intravenous contrast. Angiographic images of the head were obtained using MRA technique without contrast.  COMPARISON:  Head CT without contrast 0305 hr today. FINDINGS: MRI HEAD FINDINGS Brain: There is a small linear 7 millimeter focus of restricted diffusion in the dorsal, inferior right lentiform nuclei near the posterior limbs of the right deep white matter capsules. See series 3, image 82 and series 4, image 27. Subtle T2 and FLAIR hyperintensity. No associated hemorrhage or mass effect. Additionally, there is a small subtle focus of restricted diffusion in the right parietal lobe on series 3, image 91 (series 5, image 42). No convincing T2 or FLAIR hyperintensity here. No contralateral left hemisphere or posterior fossa restricted diffusion. Normal cerebral volume. No midline shift, mass effect, evidence of mass lesion, ventriculomegaly, extra-axial collection or acute intracranial hemorrhage. Outside of the acute findings  gray and white matter signal is normal for age throughout the brain. No chronic cerebral blood products or cortical encephalomalacia. Cervicomedullary junction and pituitary are within normal limits. Vascular: Major intracranial vascular flow voids are preserved, the distal left vertebral artery appears dominant. Skull and upper cervical spine: Negative visible cervical spine. Visualized bone marrow signal is within normal limits. Sinuses/Orbits: Normal orbits soft tissues. Paranasal sinuses are clear. Other: Mastoid air cells are clear. Grossly normal visible internal auditory structures. Incidental right posterior scalp benign sebaceous cysts. MRA HEAD FINDINGS Intermittently degraded by motion artifact. Antegrade flow in the distal left vertebral artery which appears dominant and supplies the basilar. A diminutive distal right vertebral artery appears to terminated in PICA. The left PICA origin is patent. The basilar artery is patent. There is mild to moderate distal basilar stenosis, although this might be exaggerated by motion artifact. Fetal type bilateral PCA origins. The bilateral PCA  branches appear symmetric. Antegrade flow in both ICA siphons. No definite siphon stenosis. Ophthalmic and posterior communicating artery origins appear to remain normal. Carotid termini, MCA and ACA origins are patent. MCA and ACA branch detail is degraded by motion. The MCA bifurcations are patent. Overall symmetric appearance of the bilateral anterior circulation branches. IMPRESSION: 1. Two small acute lacunar infarcts: In the posterior right lentiform near the posterior limbs of the deep white matter capsules, and in the right parietal lobe. No associated hemorrhage or mass effect. 2. Otherwise normal for age noncontrast MRI appearance of the brain. 3. Intracranial MRA is degraded by motion. No emergent large vessel occlusion. Evidence of atherosclerosis and up to moderate stenosis in the distal basilar artery. Electronically Signed   By: Odessa Fleming M.D.   On: 07/28/2017 07:36   Ct Head Code Stroke Wo Contrast  Result Date: 07/28/2017 CLINICAL DATA:  Code stroke. Left-sided weakness and difficulty speaking EXAM: CT HEAD WITHOUT CONTRAST TECHNIQUE: Contiguous axial images were obtained from the base of the skull through the vertex without intravenous contrast. COMPARISON:  None. FINDINGS: Brain: No mass lesion or acute hemorrhage. No focal hypoattenuation of the basal ganglia or cortex to indicate infarcted tissue. No hydrocephalus or age advanced atrophy. Vascular: No hyperdense vessel. No advanced atherosclerotic calcification of the arteries at the skull base. Skull: Normal visualized skull base, calvarium and extracranial soft tissues. Sinuses/Orbits: No sinus fluid levels or advanced mucosal thickening. No mastoid effusion. Normal orbits. ASPECTS [facility] Stroke Program Early CT Score) - Ganglionic level infarction (caudate, lentiform nuclei, internal capsule, insula, M1-M3 cortex): 7 - Supraganglionic infarction (M4-M6 cortex): 3 Total score (0-10 with 10 being normal): 10 IMPRESSION: 1. No hemorrhage or  mass effect. 2. ASPECTS is 10. These results were called by telephone at the time of interpretation on 07/28/2017 at 3:22 am to Dr. Jaci Carrel , who verbally acknowledged these results. Electronically Signed   By: Deatra Robinson M.D.   On: 07/28/2017 03:22    Microbiology: No results found for this or any previous visit (from the past 240 hour(s)).   Labs: Basic Metabolic Panel: Recent Labs  Lab 07/28/17 0302 07/28/17 0313 07/29/17 0533  NA 141 141 138  K 3.4* 3.3* 3.5  CL 104 102 102  CO2 27  --  24  GLUCOSE 119* 116* 113*  BUN 14 13 17   CREATININE 0.98 1.00 1.02*  CALCIUM 10.2  --  10.2  MG 1.8  --   --   PHOS 2.8  --   --    Liver Function Tests: Recent Labs  Lab 07/28/17 0302 07/29/17 0533  AST 30 30  ALT 30 30  ALKPHOS 56 54  BILITOT 0.5 0.8  PROT 8.0 8.0  ALBUMIN 4.4 4.3   No results for input(s): LIPASE, AMYLASE in the last 168 hours. No results for input(s): AMMONIA in the last 168 hours. CBC: Recent Labs  Lab 07/28/17 0302 07/28/17 0313 07/29/17 0533  WBC 6.3  --  6.8  NEUTROABS 2.3  --   --   HGB 12.8 13.9 13.1  HCT 38.9 41.0 41.4  MCV 85.3  --  86.8  PLT 292  --  321   Cardiac Enzymes: No results for input(s): CKTOTAL, CKMB, CKMBINDEX, TROPONINI in the last 168 hours. BNP: BNP (last 3 results) No results for input(s): BNP in the last 8760 hours.  ProBNP (last 3 results) No results for input(s): PROBNP in the last 8760 hours.  CBG: Recent Labs  Lab 07/28/17 0307  GLUCAP 107*       Signed:  Ramiro Harvest MD.  [facility] 07/29/2017, 1:05 PM

## 2017-07-29 NOTE — Discharge Instructions (Signed)
1. Follow-up with Dr. Gerilyn Pilgrimoonquah in 3 weeks for follow-up on acute CVA. 2. [facility].  On follow-up patient will need a basic metabolic profile done to follow-up on electrolytes and renal function.  Patient will need risk factor modification with further management of blood pressure  And hyperlipidemia.

## 2017-07-29 NOTE — Progress Notes (Signed)
OT Cancellation Note  Patient Details Name: Tiffany King MRN: [id number] DOB: 06/12/1955   Cancelled Treatment:    Reason Eval/Treat Not Completed: OT screened, no needs identified, will sign off. Pt is independent in B/ADLs. No further OT needs.   Ezra SitesLeslie Kamuela Magos, OTR/L  [phone] 07/29/2017, 9:46 AM

## 2017-07-29 NOTE — Care Management Note (Signed)
Case Management Note  Patient Details  Name: Leatrice JewelsVickie M Dubiel MRN: [id number] Date of Birth: 11/01/1955  Subjective/Objective:  Adm for TIA workup. PT/OT with no recommendations. Patient up in room ad lib with no difficulties. CM received consult for help with medications. Patient has insurance with prescription coverage, [facility]  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  We discuss continuing to use [facility] as they are reasonable with prices and to utilize [facility] if needed.                   Action/Plan: DC home with self care. No CM needs.    Expected Discharge Date:  07/30/17               Expected Discharge Plan:  Home/Self Care  In-House Referral:     Discharge planning Services  CM Consult, Medication Assistance  Post Acute Care Choice:  NA Choice offered to:  NA  DME Arranged:    DME Agency:     HH Arranged:    HH Agency:     Status of Service:  Completed, signed off  If discussed at [employer] Length of Stay Meetings, dates discussed:    Additional Comments:  Attila Mccarthy, Chrystine OilerSharley Diane, RN 07/29/2017, 12:07 PM

## 2017-07-29 NOTE — Progress Notes (Signed)
*  PRELIMINARY RESULTS* Echocardiogram 2D Echocardiogram has been performed.  [name], Edrik Rundle J 07/29/2017, 9:35 AM

## 2017-07-29 NOTE — Progress Notes (Signed)
Discharge instructions gone over with patient and spouse, verbalized understanding. IV removed, patient tolerated procedure well. Printed prescriptions given to patient. Follow up doctor appointments made for patient and placed into discharge summary.

## 2017-07-29 NOTE — Progress Notes (Signed)
*  PRELIMINARY RESULTS* Echocardiogram Patient blood pressure and heart rate monitored for 30 mins after definity was given. All vitals are good.  [name], [name] J 07/29/2017, 4:50 PM

## 2017-07-30 LAB — HIV ANTIBODY (ROUTINE TESTING W REFLEX): HIV SCREEN 4TH GENERATION: NONREACTIVE

## 2017-10-09 ENCOUNTER — Emergency Department (HOSPITAL_COMMUNITY): Payer: BLUE CROSS/BLUE SHIELD

## 2017-10-09 ENCOUNTER — Emergency Department (HOSPITAL_COMMUNITY)
Admission: EM | Admit: 2017-10-09 | Discharge: 2017-10-09 | Disposition: A | Discharge status: Home / Self Care | Payer: BLUE CROSS/BLUE SHIELD | Attending: Emergency Medicine | Admitting: Emergency Medicine

## 2017-10-09 ENCOUNTER — Encounter (HOSPITAL_COMMUNITY): Payer: Self-pay | Admitting: Emergency Medicine

## 2017-10-09 ENCOUNTER — Other Ambulatory Visit: Payer: Self-pay

## 2017-10-09 DIAGNOSIS — I10 Essential (primary) hypertension: Secondary | ICD-10-CM | POA: Diagnosis not present

## 2017-10-09 DIAGNOSIS — M1990 Unspecified osteoarthritis, unspecified site: Secondary | ICD-10-CM | POA: Insufficient documentation

## 2017-10-09 DIAGNOSIS — R0789 Other chest pain: Secondary | ICD-10-CM | POA: Diagnosis present

## 2017-10-09 DIAGNOSIS — Z8673 Personal history of transient ischemic attack (TIA), and cerebral infarction without residual deficits: Secondary | ICD-10-CM | POA: Diagnosis not present

## 2017-10-09 DIAGNOSIS — Z7982 Long term (current) use of aspirin: Secondary | ICD-10-CM | POA: Insufficient documentation

## 2017-10-09 DIAGNOSIS — Z79899 Other long term (current) drug therapy: Secondary | ICD-10-CM | POA: Insufficient documentation

## 2017-10-09 DIAGNOSIS — M19012 Primary osteoarthritis, left shoulder: Secondary | ICD-10-CM

## 2017-10-09 DIAGNOSIS — R079 Chest pain, unspecified: Secondary | ICD-10-CM

## 2017-10-09 HISTORY — DX: Cerebral infarction, unspecified: I63.9

## 2017-10-09 LAB — CBC WITH DIFFERENTIAL/PLATELET
BASOS ABS: 0 10*3/uL (ref 0.0–0.1)
BASOS PCT: 0 %
EOS ABS: 0.1 10*3/uL (ref 0.0–0.7)
Eosinophils Relative: 2 %
HCT: 39.3 % (ref 36.0–46.0)
Hemoglobin: 12.7 g/dL (ref 12.0–15.0)
LYMPHS PCT: 45 %
Lymphs Abs: 1.8 10*3/uL (ref 0.7–4.0)
MCH: 27.5 pg (ref 26.0–34.0)
MCHC: 32.3 g/dL (ref 30.0–36.0)
MCV: 85.2 fL (ref 78.0–100.0)
MONO ABS: 0.4 10*3/uL (ref 0.1–1.0)
Monocytes Relative: 10 %
Neutro Abs: 1.7 10*3/uL (ref 1.7–7.7)
Neutrophils Relative %: 43 %
Platelets: 278 10*3/uL (ref 150–400)
RBC: 4.61 MIL/uL (ref 3.87–5.11)
RDW: 13.8 % (ref 11.5–15.5)
WBC: 4 10*3/uL (ref 4.0–10.5)

## 2017-10-09 LAB — COMPREHENSIVE METABOLIC PANEL
ALBUMIN: 4.5 g/dL (ref 3.5–5.0)
ALT: 24 U/L (ref 14–54)
AST: 19 U/L (ref 15–41)
Alkaline Phosphatase: 64 U/L (ref 38–126)
Anion gap: 12 (ref 5–15)
BUN: 6 mg/dL (ref 6–20)
CHLORIDE: 103 mmol/L (ref 101–111)
CO2: 23 mmol/L (ref 22–32)
CREATININE: 0.79 mg/dL (ref 0.44–1.00)
Calcium: 10.3 mg/dL (ref 8.9–10.3)
GFR calc Af Amer: >60 mL/min (ref >60)
GFR calc non Af Amer: >60 mL/min (ref >60)
Glucose, Bld: 104 mg/dL — ABNORMAL HIGH (ref 65–99)
POTASSIUM: 3.5 mmol/L (ref 3.5–5.1)
SODIUM: 138 mmol/L (ref 135–145)
Total Bilirubin: 0.9 mg/dL (ref 0.3–1.2)
Total Protein: 7.9 g/dL (ref 6.5–8.1)

## 2017-10-09 LAB — TROPONIN I: Troponin I: <0.03 ng/mL (ref <0.03)

## 2017-10-09 MED ORDER — METHOCARBAMOL 500 MG PO TABS: 500.0000 mg | ORAL_TABLET | Freq: Three times a day (TID) | ORAL | 0 refills | Status: AC | PRN

## 2017-10-09 MED ORDER — METHOCARBAMOL 500 MG PO TABS
500.0000 mg | ORAL_TABLET | Freq: Once | ORAL | Status: AC
  Administered 2017-10-09: 500 mg via ORAL
  Filled 2017-10-09: qty 1

## 2017-10-09 MED ORDER — ACETAMINOPHEN 325 MG PO TABS: 650.0000 mg | ORAL_TABLET | ORAL | Status: DC | PRN

## 2017-10-09 NOTE — ED Triage Notes (Signed)
Pt c/o sharpness to left shoulder down left arm. Started this am. Denies dizziness/n/v/d/sweating. No other pains. Took tylenol earlier and helped but pain came back. Nad. Hx of stroke and does not use this left arm at all.

## 2017-10-09 NOTE — ED Notes (Signed)
Pt keeps hollaring that she is having cp after clarifying that she is pointing to her shoulder and down her arm. ekg in process in triage. Nad.

## 2017-10-09 NOTE — ED Provider Notes (Signed)
[facility] EMERGENCY DEPARTMENT Provider Note   CSN: [id number] Arrival date & time: 10/09/17  1618     History   Chief Complaint Chief Complaint  Patient presents with  . Shoulder Pain    HPI Tiffany King is a 62 y.o. female.  HPI Patient with history of previous stroke and left upper extremity weakness and contraction.  Also has known left-sided facial droop and mild left lower extremity weakness.  Patient walks with walker.  States she woke this morning with worsening pain to the left upper chest and shoulder.  Pain is worse with movement or palpation.  Has been in physical therapy and had increased activity over the last few days.  Denies shortness of breath or cough.  No new weakness or numbness. Past Medical History:  Diagnosis Date  . Hyperlipidemia   . Hypertension   . Stroke [facility])     Patient Active Problem List   Diagnosis Date Noted  . CVA (cerebral vascular accident) (HCC) 07/29/2017  . Hypertension 08/19/2013  . HLD (hyperlipidemia) 08/19/2013    Past Surgical History:  Procedure Laterality Date  . TUBAL LIGATION       OB History   None      Home Medications    Prior to Admission medications   Medication Sig Start Date End Date Taking? Authorizing Provider  acetaminophen (TYLENOL) 500 MG tablet Take 250-500 mg by mouth every 6 (six) hours as needed. For pain   Yes [provider]  Ascorbic Acid (VITAMIN C PO) Take 1 tablet by mouth daily.   Yes [provider]  aspirin 325 MG tablet Take 1 tablet (325 mg total) by mouth daily. 07/30/17  Yes [name], MD  atorvastatin (LIPITOR) 40 MG tablet Take 1 tablet (40 mg total) by mouth daily at 6 PM. 07/29/17  Yes [name], MD  famotidine (PEPCID) 20 MG tablet Take 1 tablet (20 mg total) by mouth 2 (two) times daily. Take one tablet twice daily for two days Patient taking differently: Take 20 mg by mouth 2 (two) times daily as needed for heartburn. Take one tablet  twice daily for two days 06/19/17  Yes [name], MD  fluticasone [facility]) 50 MCG/ACT nasal spray Place 2 sprays into both nostrils daily.   Yes [provider]  lisinopril (PRINIVIL,ZESTRIL) 20 MG tablet Take 20 mg by mouth daily.    Yes [provider]  nitroGLYCERIN (NITROSTAT) 0.4 MG SL tablet Place 0.4 mg under the tongue every 5 (five) minutes as needed.  09/18/17  Yes [provider]  verapamil (CALAN) 120 MG tablet Take 120 mg by mouth daily.    Yes [provider]  methocarbamol (ROBAXIN) 500 MG tablet Take 1 tablet (500 mg total) by mouth every 8 (eight) hours as needed for muscle spasms. 10/09/17   [name], MD    Family History Family History  Problem Relation Age of Onset  . Stroke Mother   . Heart disease Mother     Social History Social History   Tobacco Use  . Smoking status: Never Smoker  . Smokeless tobacco: Never Used  Substance Use Topics  . Alcohol use: No  . Drug use: No     Allergies   Shellfish allergy and Bee venom   Review of Systems Review of Systems  Constitutional: Negative for chills and fever.  Respiratory: Negative for cough and shortness of breath.   Cardiovascular: Positive for chest pain. Negative for palpitations.  Gastrointestinal: Negative  for abdominal pain, constipation, diarrhea, nausea and vomiting.  Musculoskeletal: Positive for arthralgias and myalgias. Negative for neck pain and neck stiffness.  Skin: Negative for rash and wound.  Neurological: Positive for weakness. Negative for dizziness, numbness and headaches.  All other systems reviewed and are negative.    Physical Exam Updated Vital Signs BP (!) 176/95   Pulse 91   Temp 98.5 F (36.9 C) (Oral)   Resp 15   Ht 5' 1.5" (1.562 m)   Wt 67.6 kg (149 lb)   SpO2 98%   BMI 27.70 kg/m   Physical Exam  Constitutional: She is oriented to person, place, and time. She appears well-developed and well-nourished. No distress.    HENT:  Head: Normocephalic and atraumatic.  Mouth/Throat: Oropharynx is clear and moist.  Left-sided lower facial droop  Eyes: Pupils are equal, round, and reactive to light. EOM are normal.  Neck: Normal range of motion. Neck supple.  No meningismus.  No lymphadenopathy.  No posterior midline cervical tenderness to palpation.  Patient does have some tenderness to palpation of the left trapezius.  Cardiovascular: Normal rate and regular rhythm. Exam reveals no gallop and no friction rub.  No murmur heard. Pulmonary/Chest: Effort normal and breath sounds normal. No stridor. No respiratory distress. She has no wheezes. She has no rales. She exhibits no tenderness.  Abdominal: Soft. Bowel sounds are normal. There is no tenderness. There is no rebound and no guarding.  Musculoskeletal: She exhibits tenderness. She exhibits no edema.  Patient with contracted left upper extremity.  Decreased range of motion and tenderness to palpation over the left shoulder.  No obvious deformity, erythema or warmth.  No extremity swelling.  Distal pulses are 2+.  Neurological: She is alert and oriented to person, place, and time.  1/5 motor in left upper extremity.  4/5 motor in left lower extremity.  5/5 motor in right upper and lower extremities.  Sensation grossly intact.  Skin: Skin is warm and dry. Capillary refill takes less than 2 seconds. No rash noted. She is not diaphoretic. No erythema.  Psychiatric: She has a normal mood and affect. Her behavior is normal.  Nursing note and vitals reviewed.    ED Treatments / Results  Labs (all labs ordered are listed, but only abnormal results are displayed) Labs Reviewed  COMPREHENSIVE METABOLIC PANEL - Abnormal; Notable for the following components:      Result Value   Glucose, Bld 104 (*)    All other components within normal limits  CBC WITH DIFFERENTIAL/PLATELET  TROPONIN I    EKG EKG Interpretation  Date/Time:  Friday Oct 09 2017 17:59:58  EDT Ventricular Rate:  90 PR Interval:  130 QRS Duration: 74 QT Interval:  354 QTC Calculation: 433 R Axis:   54 Text Interpretation:  Normal sinus rhythm Cannot rule out Anterior infarct , age undetermined Abnormal ECG Confirmed by [name] ([id number]) on 10/09/2017 7:27:54 PM   Radiology Dg Chest 1 View  Result Date: 10/09/2017 CLINICAL DATA:  Sharpness to left shoulder down the arm EXAM: CHEST  1 VIEW COMPARISON:  07/28/2017 FINDINGS: The heart size and mediastinal contours are within normal limits. Both lungs are clear. AC joint degenerative changes. IMPRESSION: No active disease. Electronically Signed   By: Jasmine Pang M.D.   On: 10/09/2017 19:32    Procedures Procedures (including critical care time)  Medications Ordered in ED Medications  acetaminophen (TYLENOL) tablet 650 mg (has no administration in time range)  methocarbamol (ROBAXIN) tablet 500 mg (500  mg Oral Given 10/09/17 1924)     Initial Impression / Assessment and Plan / ED Course  I have reviewed the triage vital signs and the nursing notes.  Pertinent labs & imaging results that were available during my care of the patient were reviewed by me and considered in my medical decision making (see chart for details).     Per family patient's neurologic status is unchanged.  Her left upper chest and shoulder pain appears to be musculoskeletal.  Will screen and treat symptomatically. Patient states her pain is significantly improved after Tylenol and Robaxin.  Laboratory work-up without acute findings.  Chest x-ray with degenerative changes of the left AC joint.  This likely contributing to her symptoms.  Advised to follow-up with orthopedist or symptoms persist.  Return precautions have been given. Final Clinical Impressions(s) / ED Diagnoses   Final diagnoses:  Degenerative joint disease of left acromioclavicular joint  Left-sided chest wall pain    ED Discharge Orders        Ordered    methocarbamol  (ROBAXIN) 500 MG tablet  Every 8 hours PRN     10/09/17 2044       [name], MD 10/09/17 2045

## 2017-10-09 NOTE — ED Notes (Signed)
Pt has not had her HTN meds

## 2017-10-29 ENCOUNTER — Ambulatory Visit: Payer: BLUE CROSS/BLUE SHIELD | Admitting: Orthopaedic Surgery

## 2017-10-29 ENCOUNTER — Ambulatory Visit (INDEPENDENT_AMBULATORY_CARE_PROVIDER_SITE_OTHER): Payer: BLUE CROSS/BLUE SHIELD

## 2017-10-29 ENCOUNTER — Encounter: Payer: Self-pay | Admitting: Orthopaedic Surgery

## 2017-10-29 VITALS — BP 169/86 | HR 85 | Ht 61.0 in

## 2017-10-29 DIAGNOSIS — G8194 Hemiplegia, unspecified affecting left nondominant side: Secondary | ICD-10-CM

## 2017-10-29 DIAGNOSIS — I10 Essential (primary) hypertension: Secondary | ICD-10-CM

## 2017-10-29 DIAGNOSIS — Z8673 Personal history of transient ischemic attack (TIA), and cerebral infarction without residual deficits: Secondary | ICD-10-CM

## 2017-10-29 DIAGNOSIS — M25512 Pain in left shoulder: Secondary | ICD-10-CM

## 2017-10-29 NOTE — Progress Notes (Addendum)
Subjective:  My left shoulder hurts    Patient ID: Tiffany King, female    DOB: [birth year], 62 y.o.   MRN: [id number]  HPI She had a CVA in February of this year affecting her left side.  She has left hemiparesis from the stroke.  She went to [facility] in [place] after the stroke for a month of therapy.  Then she had home health at her home.  She has left sided weakness and spasms of the left foot and lower extremity.  Her husband is a CNA and has been taking care of her. He is doing the exercises and stretching with her.    She had some left shoulder pain and went to the ER.  X-rays were done of the chest and showed some DJD changes of the left clavicle.  She was told to come here for the left shoulder.  She has no trauma. She has no redness.    Review of Systems  Constitutional: Positive for activity change.  Respiratory: Negative for cough and shortness of breath.   Cardiovascular: Negative for chest pain and leg swelling.  Endocrine: Positive for cold intolerance.  Musculoskeletal: Positive for arthralgias and gait problem.  Neurological: Positive for tremors and weakness.       Left hemiparesis with spasm of the left lower extremity.  All other systems reviewed and are negative.  Past Medical History:  Diagnosis Date  . Hyperlipidemia   . Hypertension   . Stroke [facility])     Past Surgical History:  Procedure Laterality Date  . TUBAL LIGATION      Current Outpatient Medications on File Prior to Visit  Medication Sig Dispense Refill  . acetaminophen (TYLENOL) 500 MG tablet Take 250-500 mg by mouth every 6 (six) hours as needed. For pain    . Ascorbic Acid (VITAMIN C PO) Take 1 tablet by mouth daily.    Marland Kitchen aspirin 325 MG tablet Take 1 tablet (325 mg total) by mouth daily.    Marland Kitchen atorvastatin (LIPITOR) 40 MG tablet Take 1 tablet (40 mg total) by mouth daily at 6 PM. 30 tablet 0  . famotidine (PEPCID) 20 MG tablet Take 1 tablet (20 mg total) by mouth 2 (two) times daily. Take  one tablet twice daily for two days (Patient taking differently: Take 20 mg by mouth 2 (two) times daily as needed for heartburn. Take one tablet twice daily for two days) 10 tablet 0  . fluticasone (FLONASE) 50 MCG/ACT nasal spray Place 2 sprays into both nostrils daily.    Marland Kitchen lisinopril (PRINIVIL,ZESTRIL) 20 MG tablet Take 20 mg by mouth daily.     . methocarbamol (ROBAXIN) 500 MG tablet Take 1 tablet (500 mg total) by mouth every 8 (eight) hours as needed for muscle spasms. 30 tablet 0  . nitroGLYCERIN (NITROSTAT) 0.4 MG SL tablet Place 0.4 mg under the tongue every 5 (five) minutes as needed.   0  . verapamil (CALAN) 120 MG tablet Take 120 mg by mouth daily.      No current facility-administered medications on file prior to visit.     Social History   Socioeconomic History  . Marital status: Married    Spouse name: Not on file  . Number of children: Not on file  . Years of education: Not on file  . Highest education level: Not on file  Occupational History  . Not on file  Social Needs  . Financial resource strain: Not on file  . Food insecurity:  Worry: Not on file    Inability: Not on file  . Transportation needs:    Medical: Not on file    Non-medical: Not on file  Tobacco Use  . Smoking status: Never Smoker  . Smokeless tobacco: Never Used  Substance and Sexual Activity  . Alcohol use: No  . Drug use: No  . Sexual activity: Not on file  Lifestyle  . Physical activity:    Days per week: Not on file    Minutes per session: Not on file  . Stress: Not on file  Relationships  . Social connections:    Talks on phone: Not on file    Gets together: Not on file    Attends religious service: Not on file    Active member of club or organization: Not on file    Attends meetings of clubs or organizations: Not on file    Relationship status: Not on file  . Intimate partner violence:    Fear of current or ex partner: Not on file    Emotionally abused: Not on file     Physically abused: Not on file    Forced sexual activity: Not on file  Other Topics Concern  . Not on file  Social History Narrative  . Not on file    Family History  Problem Relation Age of Onset  . Stroke Mother   . Heart disease Mother     BP (!) 169/86   Pulse 85   Ht  (1.549 m)   BMI 28.15 kg/m      Objective:   Physical Exam  Constitutional: She is oriented to person, place, and time. She appears well-developed and well-nourished.  HENT:  Head: Normocephalic and atraumatic.  Eyes: Pupils are equal, round, and reactive to light. Conjunctivae and EOM are normal.  Neck: Normal range of motion. Neck supple.  Cardiovascular: Normal rate, regular rhythm and intact distal pulses.  Pulmonary/Chest: Effort normal.  Abdominal: Soft.  Musculoskeletal:       Left shoulder: She exhibits decreased range of motion and tenderness.       Arms: Neurological: She is alert and oriented to person, place, and time. She displays abnormal reflex. No cranial nerve deficit. She exhibits abnormal muscle tone. Coordination abnormal.  Left sided hemiparesis.  Spasm of the left lower extremity.  Hand on left with flexion deformity.  In wheelchair.  Skin: Skin is warm and dry.  Psychiatric: She has a normal mood and affect. Her behavior is normal. Judgment and thought content normal.     X-rays of the left shoulder were done, reported separately.    Assessment & Plan:   Encounter Diagnoses  Name Primary?  . Pain in joint of left shoulder Yes  . History of stroke   . Left hemiparesis (HCC)   . Essential hypertension    I have gone over the x-rays with her.  I have recommended hand therapy and for them to make a brace for the left hand to prevent contracture deformity.  I will see her as needed.  The arthritis of the [facility] joint is long standing.  Her pain is related to the CVA.  Electronically Signed Darreld Mclean, MD 5/23/201912:08 PM

## 2017-10-29 NOTE — Patient Instructions (Signed)
  Physical therapy has been ordered for you at [place] [phone] is the phone number to call if you want to call to schedule. Please let us know if you do not hear anything within one week.  

## 2017-11-04 ENCOUNTER — Ambulatory Visit (HOSPITAL_COMMUNITY): Payer: BLUE CROSS/BLUE SHIELD | Attending: Orthopaedic Surgery | Admitting: Specialist

## 2017-11-04 ENCOUNTER — Other Ambulatory Visit: Payer: Self-pay

## 2017-11-04 DIAGNOSIS — M25512 Pain in left shoulder: Secondary | ICD-10-CM | POA: Diagnosis present

## 2017-11-04 DIAGNOSIS — M25612 Stiffness of left shoulder, not elsewhere classified: Secondary | ICD-10-CM

## 2017-11-04 DIAGNOSIS — R29818 Other symptoms and signs involving the nervous system: Secondary | ICD-10-CM | POA: Diagnosis present

## 2017-11-04 DIAGNOSIS — I69352 Hemiplegia and hemiparesis following cerebral infarction affecting left dominant side: Secondary | ICD-10-CM | POA: Insufficient documentation

## 2017-11-04 NOTE — Therapy (Signed)
[facility] [address] [place], [place], [address] Phone: [phone]   Fax:  [phone]  Occupational Therapy Evaluation  Patient Details  Name: Tiffany King MRN: [id number] Date of Birth: [birth year] Referring Provider: Dr. Darreld Mclean   Encounter Date: 11/04/2017  OT End of Session - 11/04/17 1529    Visit Number  1    Number of Visits  16    Date for OT Re-Evaluation  01/03/18 mini reassess on 12/04/17    Authorization Type  [employer] - 30 visits PT and OT combined, has used 7, approved codes:  eval, therapeutic exercises, therapeutic activities, manual therapy, self care     Authorization - Visit Number  8    Authorization - Number of Visits  30    OT Start Time  1115    OT Stop Time  1200    OT Time Calculation (min)  45 min    Activity Tolerance  Patient tolerated treatment well;Treatment limited secondary to medical complications (Comment)    Behavior During Therapy  [facility] for tasks assessed/performed       Past Medical History:  Diagnosis Date  . Hyperlipidemia   . Hypertension   . Stroke [facility])     Past Surgical History:  Procedure Laterality Date  . TUBAL LIGATION      There were no vitals filed for this visit.     [facility] OT Assessment - 11/04/17 0001      Assessment   Medical Diagnosis  Left Shoulder Pain S/P R CVA with Left Hemiparesis    Referring Provider  Dr. Darreld Mclean    Onset Date/Surgical Date  08/15/17    Hand Dominance  Left    Next MD Visit  unknown    Prior Therapy  patient reports being hospitalized from 3/9-3/15, in SNF for one month and then recieving PT, OT, ST at home       Precautions   Precautions  Fall      Restrictions   Weight Bearing Restrictions  No      Balance Screen   Has the patient fallen in the past 6 months  No    Has the patient had a decrease in activity level because of a fear of falling?   No    Is the patient reluctant to leave their home because of a  fear of falling?   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  She is able to stand to complete pant pull with her right hand.  She uses a bedside commode and can complete pant pull and hygiene.  She is not able to put her hair in a ponytail.  She is able to brush her hair, teeth with right hand.  She is not able to particpate in IADLs.  She is wc level.  SHe is  not able to use her left arm at all with any functional activities       Written Expression   Dominant Hand  Left      Vision - History   Baseline Vision  No visual deficits      Cognition   Overall Cognitive Status  Within Functional Limits for tasks assessed      Observation/Other Assessments   Focus on Therapeutic Outcomes (FOTO)   4/100      Sensation   Light Touch  Appears Intact      Coordination   Gross Motor Movements are Fluid and Coordinated  No    Fine Motor Movements are Fluid and Coordinated  No    Other  no active motor movements in left arm noted      Tone   Assessment Location  Left Upper Extremity      ROM / Strength   AROM / PROM / Strength  AROM;PROM      AROM   Overall AROM Comments  0 A/ROM in LUE      PROM   Overall PROM Comments  assessed in seated    PROM Assessment Site  Shoulder;Elbow;Forearm;Wrist;Finger    Right/Left Shoulder  Left    Left Shoulder Flexion  20 Degrees    Left Shoulder ABduction  20 Degrees    Left Shoulder Internal Rotation  70 Degrees    Left Shoulder  External Rotation  0 Degrees    Right/Left Elbow  Left    Left Elbow Flexion  140    Left Elbow Extension  -- lacks 50 degrees from neutral    Right/Left Wrist  Left    Left Wrist Extension  0 Degrees    Left Wrist Flexion  30 Degrees    Right/Left Finger  Left    Left Composite Finger Extension  75%    Left Composite Finger Flexion  75%      LUE Tone   LUE Tone  Severe;Hypertonic;Brunnstrom Scale    Brunnstrom Scale (LUE)  Affected part(s) rigid in flexion or extension stage III flexor synergy pattern                       OT Education - 11/04/17 1528    Education provided  Yes    Education Details  educated patient on bed positioning for improved arm comfort, p/rom of left upper extremity and weightshifting exercises    Person(s) Educated  Patient;Spouse    Methods  Explanation;Demonstration;Handout    Comprehension  Verbalized understanding;Returned demonstration       OT Short Term Goals - 11/04/17 1537      OT SHORT TERM GOAL #1   Title  Patient and family will be educated on a HEP for improved left arm mobility required for improved independence with daily tasks.     Time  4    Period  Weeks    Status  New    Target Date  12/04/17      OT SHORT TERM GOAL #2   Title  Patient will improve left upper extremity p/rom to 75% range for improved comfort with ADL completion.     Time  4    Period  Weeks    Status  New      OT SHORT TERM GOAL #3   Title  Patient and family will be independent with donning and doffing and care of resting hand splint for left hand.     Time  4    Period  Weeks    Status  New      OT SHORT TERM GOAL #4   Title  Patient will be able to weightbear on left forearm while completing adls with min facilitation.     Time  4    Period  Weeks    Status  New      OT SHORT TERM GOAL #5   Title  Patient will use left arm as a gross assist during adl completion.     Time  4    Period  Weeks    Status  New        OT Long Term  Goals - 11/04/17 1540      OT LONG TERM GOAL #1   Title  Patient will utilize left arm as an active assist with min facilitation during ADL completion.    Time  8    Period  Weeks    Status  New    Target Date  01/03/18      OT LONG TERM GOAL #2   Title  Patient will improve left arm p/rom to [facility] in order for improved comfort and active participation in adls.    Time  8    Period  Weeks    Status  New      OT LONG TERM GOAL #3   Title  Patient will be able to weightbear on her left arm with elbow and wrist extended with min facilitaiton.    Time  8    Period  Weeks    Status  New      OT LONG TERM GOAL #4   Title  Patient will be able to complete all B/ADLS with setup only.     Time  8    Period  Weeks    Status  New      OT LONG TERM GOAL #5   Title  Patient will use her left arm as an active assist with functional transfers.     Time  8    Period  Weeks    Status  New            Plan - 11/04/17 1559    Clinical Impression Statement  A:  Patient is a 62 year old female who suffered a CVA in March with resulting left upper and lower extermity hemiparesis.  Patient was independent prior to her CVA, working as a Hotel manager and enjoyed IT work.  She is now wc level mobility and requires assistance with transfers, B/IADLs, and leisure activities.  She is unable to use her left upper extremity actively with any of her ADL activities.      Occupational Profile and client history currently impacting functional performance  age, motivation, family support     Occupational performance deficits (Please refer to evaluation for details):  ADL's;IADL's;Rest and Sleep;Work;Leisure;Social Participation    Rehab Potential  Good    Current Impairments/barriers affecting progress:  severity of tone     OT Frequency  2x / week    OT Duration  8 weeks    OT Treatment/Interventions  Self-care/ADL training;Electrical Stimulation;Therapeutic exercise;Patient/family  education;Neuromuscular education;Moist Heat;Energy conservation;Therapeutic activities;Passive range of motion;Manual Therapy;Ultrasound;Cryotherapy;DME and/or AE instruction;Splinting    Plan  P: Skilled OT intervention 2 times per week for 8 weeks to normalize left upper extermity tone, improve range of motion, positioning of left arm and and in order to utilize left upper extremity as an active assist with  all daily activities. Next session:  P/ROM in supine, weightbearing, fit for left hand resting hand splint and order from [facility].  Follow up on PT and ST order    Clinical Decision Making  Multiple treatment options, significant modification of task necessary    OT Home Exercise Plan  bed positioning, p/rom, weightshifting    Recommended Other Services  PT and ST - order faxed to primary care md        Patient will benefit from skilled therapeutic intervention in order to improve the following deficits and impairments:  Impaired flexibility, Pain, Decreased range of motion, Decreased strength, Impaired tone, Impaired UE functional use, Decreased safety awareness  Visit Diagnosis: Hemiplegia and hemiparesis following cerebral infarction affecting left dominant side (HCC) - Plan: Ot plan of care cert/re-cert  Acute pain of left shoulder - Plan: Ot plan of care cert/re-cert  Stiffness of left shoulder, not elsewhere classified - Plan: Ot plan of care cert/re-cert  Other symptoms and signs involving the nervous system - Plan: Ot plan of care cert/re-cert    Problem List Patient Active Problem List   Diagnosis Date Noted  . CVA (cerebral vascular accident) (HCC) 07/29/2017  . Hypertension 08/19/2013  . HLD (hyperlipidemia) 08/19/2013    Shirlean Mylar, MHA, OTR/L [phone]  11/04/2017, 4:02 PM  [facility] [address] [place], [place], [address] Phone: [phone]   Fax:  [phone]  Name: Tiffany King MRN:  [id number] Date of Birth: 1956/05/28

## 2017-11-05 ENCOUNTER — Ambulatory Visit (HOSPITAL_COMMUNITY): Payer: BLUE CROSS/BLUE SHIELD | Admitting: Occupational Therapy

## 2017-11-05 ENCOUNTER — Encounter (HOSPITAL_COMMUNITY): Payer: Self-pay | Admitting: Occupational Therapy

## 2017-11-05 DIAGNOSIS — M25512 Pain in left shoulder: Secondary | ICD-10-CM

## 2017-11-05 DIAGNOSIS — I69352 Hemiplegia and hemiparesis following cerebral infarction affecting left dominant side: Secondary | ICD-10-CM

## 2017-11-05 DIAGNOSIS — R29818 Other symptoms and signs involving the nervous system: Secondary | ICD-10-CM

## 2017-11-05 DIAGNOSIS — M25612 Stiffness of left shoulder, not elsewhere classified: Secondary | ICD-10-CM

## 2017-11-05 NOTE — Patient Instructions (Signed)
Weight-bearing for Shoulder Subluxation   1) Weight-bearing lean stretch: From a seated position on your bed or bench, prop yourself up on your affected arm by placing your affected arm about a foot away from your body. Then lean into it.  Hold for 10 seconds.    OR

## 2017-11-05 NOTE — Therapy (Signed)
[facility] [address] [place], [place], [address] Phone: [phone]   Fax:  [phone]  Occupational Therapy Treatment  Patient Details  Name: Tiffany King MRN: [id number] Date of Birth: 1955-07-20 Referring Provider: Dr. Darreld Mclean   Encounter Date: 11/05/2017  OT End of Session - 11/05/17 1005    Visit Number  2    Number of Visits  16    Date for OT Re-Evaluation  01/03/18 mini reassess on 12/04/17    Authorization Type  [employer] - 30 visits PT and OT combined, has used 7, approved codes:  eval, therapeutic exercises, therapeutic activities, manual therapy, self care     Authorization - Visit Number  9    Authorization - Number of Visits  30    OT Start Time  0902    OT Stop Time  0944    OT Time Calculation (min)  42 min    Activity Tolerance  Patient tolerated treatment well;Treatment limited secondary to medical complications (Comment)    Behavior During Therapy  [facility] for tasks assessed/performed       Past Medical History:  Diagnosis Date  . Hyperlipidemia   . Hypertension   . Stroke [facility])     Past Surgical History:  Procedure Laterality Date  . TUBAL LIGATION      There were no vitals filed for this visit.  Subjective Assessment - 11/05/17 0901    Subjective   S: I stretch my fingers out at home.     Currently in Pain?  No/denies         [facility] OT Assessment - 11/05/17 0901      Assessment   Medical Diagnosis  Left Shoulder Pain S/P R CVA with Left Hemiparesis      Precautions   Precautions  Fall               OT Treatments/Exercises (OP) - 11/05/17 0957      Exercises   Exercises  --      Neurological Re-education Exercises   Scapular Stabilization  Left;10 reps;Side-lying;Seated Passive: elevation/depression; protraction/retraction    Other Information  Pt measured for resting hand splint    Shoulder Flexion  PROM;10 reps;Limitations <25% range    Shoulder ABduction  PROM;10  reps;Limitations    Shoulder Abduction Limitations  <25% range    Forearm Supination  PROM;10 reps    Forearm Pronation  PROM;10 reps    Wrist Flexion  PROM;10 reps    Wrist Extension  PROM;10 reps    Wrist Radial Deviation  PROM;10 reps    Wrist Ulnar Deviation  PROM;10 reps    Weight Bearing Position  Standing    Standing with weight shifting on and off  Pt standing at mat table raised to waist height, weightbearing on forearm, reaching for cones on the right and placing on far left. 10 cones    Other Weight-Bearing Exercises 1  Pt sat on mat while leaning to left to weightbear on forearm, reaching across with right arm to give OT high 5 at various heights. 3 trials, holding position for 15-20 seconds each             OT Education - 11/05/17 1004    Education provided  Yes    Education Details  weightbearing on forearm; provided evaluation and reviewed goals    Person(s) Educated  Patient;Spouse    Methods  Explanation;Demonstration;Handout    Comprehension  Verbalized understanding;Returned demonstration  OT Short Term Goals - 11/05/17 1004      OT SHORT TERM GOAL #1   Title  Patient and family will be educated on a HEP for improved left arm mobility required for improved independence with daily tasks.     Time  4    Period  Weeks    Status  On-going      OT SHORT TERM GOAL #2   Title  Patient will improve left upper extremity p/rom to 75% range for improved comfort with ADL completion.     Time  4    Period  Weeks    Status  On-going      OT SHORT TERM GOAL #3   Title  Patient and family will be independent with donning and doffing and care of resting hand splint for left hand.     Time  4    Period  Weeks    Status  On-going      OT SHORT TERM GOAL #4   Title  Patient will be able to weightbear on left forearm while completing adls with min facilitation.     Time  4    Period  Weeks    Status  On-going      OT SHORT TERM GOAL #5   Title  Patient will  use left arm as a gross assist during adl completion.     Time  4    Period  Weeks    Status  On-going        OT Long Term Goals - 11/05/17 1004      OT LONG TERM GOAL #1   Title  Patient will utilize left arm as an active assist with min facilitation during ADL completion.    Time  8    Period  Weeks    Status  On-going      OT LONG TERM GOAL #2   Title  Patient will improve left arm p/rom to [facility] in order for improved comfort and active participation in adls.    Time  8    Period  Weeks    Status  On-going      OT LONG TERM GOAL #3   Title  Patient will be able to weightbear on her left arm with elbow and wrist extended with min facilitaiton.    Time  8    Period  Weeks    Status  On-going      OT LONG TERM GOAL #4   Title  Patient will be able to complete all B/ADLS with setup only.     Time  8    Period  Weeks    Status  On-going      OT LONG TERM GOAL #5   Title  Patient will use her left arm as an active assist with functional transfers.     Time  8    Period  Weeks    Status  On-going            Plan - 11/05/17 1005    Clinical Impression Statement  A: Initiated passive stretching to LUE, weightbearing activities to reduce tone and improve P/ROM. Pt is limited to <25% range due to pain with further stretching. Pt did well with weightbearing activities, cuing to place max weight on arm. Verbal cuing for form and technique with activities. Also measured for resting hand splint and will order.     Plan  P: follow up on HEP and continue with weightbearing  activities working to decrease tone and promote greater ROM during passive stretching       Patient will benefit from skilled therapeutic intervention in order to improve the following deficits and impairments:  Impaired flexibility, Pain, Decreased range of motion, Decreased strength, Impaired tone, Impaired UE functional use, Decreased safety awareness  Visit Diagnosis: Hemiplegia and hemiparesis following  cerebral infarction affecting left dominant side (HCC)  Acute pain of left shoulder  Stiffness of left shoulder, not elsewhere classified  Other symptoms and signs involving the nervous system    Problem List Patient Active Problem List   Diagnosis Date Noted  . CVA (cerebral vascular accident) (HCC) 07/29/2017  . Hypertension 08/19/2013  . HLD (hyperlipidemia) 08/19/2013   Ezra Sites, OTR/L  [phone] 11/05/2017, 11:25 AM  [facility] [address] [place], [place], [address] Phone: [phone]   Fax:  [phone]  Name: Tiffany King MRN: [id number] Date of Birth: Feb 13, 1956

## 2017-11-09 ENCOUNTER — Encounter (HOSPITAL_COMMUNITY): Payer: Self-pay | Admitting: Specialist

## 2017-11-09 ENCOUNTER — Ambulatory Visit (HOSPITAL_COMMUNITY): Payer: BLUE CROSS/BLUE SHIELD | Attending: Orthopaedic Surgery | Admitting: Specialist

## 2017-11-09 DIAGNOSIS — M6281 Muscle weakness (generalized): Secondary | ICD-10-CM | POA: Insufficient documentation

## 2017-11-09 DIAGNOSIS — R29818 Other symptoms and signs involving the nervous system: Secondary | ICD-10-CM

## 2017-11-09 DIAGNOSIS — I69352 Hemiplegia and hemiparesis following cerebral infarction affecting left dominant side: Secondary | ICD-10-CM | POA: Insufficient documentation

## 2017-11-09 DIAGNOSIS — R262 Difficulty in walking, not elsewhere classified: Secondary | ICD-10-CM | POA: Diagnosis present

## 2017-11-09 DIAGNOSIS — M25612 Stiffness of left shoulder, not elsewhere classified: Secondary | ICD-10-CM | POA: Diagnosis present

## 2017-11-09 DIAGNOSIS — M25512 Pain in left shoulder: Secondary | ICD-10-CM

## 2017-11-09 DIAGNOSIS — R471 Dysarthria and anarthria: Secondary | ICD-10-CM | POA: Insufficient documentation

## 2017-11-09 NOTE — Therapy (Signed)
[facility] [address] [place], [place], [address] Phone: [phone]   Fax:  [phone]  Occupational Therapy Treatment  Patient Details  Name: Tiffany King MRN: [id number] Date of Birth: 1955-08-27 Referring Provider: Dr. Darreld Mclean   Encounter Date: 11/09/2017  OT End of Session - 11/09/17 1545    Visit Number  3    Number of Visits  16    Date for OT Re-Evaluation  01/03/18 mini reassess on 6/28    Authorization Type  [employer] - 30 visits PT and OT combined, has used 7, approved codes:  eval, therapeutic exercises, therapeutic activities, manual therapy, self care     Authorization - Visit Number  10    Authorization - Number of Visits  30    OT Start Time  1305    OT Stop Time  1345    OT Time Calculation (min)  40 min    Activity Tolerance  Patient tolerated treatment well;Treatment limited secondary to medical complications (Comment)    Behavior During Therapy  [facility] for tasks assessed/performed       Past Medical History:  Diagnosis Date  . Hyperlipidemia   . Hypertension   . Stroke [facility])     Past Surgical History:  Procedure Laterality Date  . TUBAL LIGATION      There were no vitals filed for this visit.  Subjective Assessment - 11/09/17 1540    Subjective   S: My arm was very sore this weekend.  I couldnt sleep it was so sore.      Pertinent History  followed up regarding referral request from Dr. Sherryll Burger for PT/ST - no signed order recieved to date, refaxed order.     Currently in Pain?  No/denies at rest, with P/ROM increases          [facility] OT Assessment - 11/09/17 0001      Assessment   Medical Diagnosis  Left Shoulder Pain S/P R CVA with Left Hemiparesis      Precautions   Precautions  Fall               OT Treatments/Exercises (OP) - 11/09/17 0001      Neurological Re-education Exercises   Scapular Stabilization  Left;10 reps;Seated;Limitations    Other Information  with  mobilization to scapula provided during elevation and retraction, completed 10 reps 3 sets     Shoulder Flexion  PROM;10 reps;Limitations    Shoulder ABduction  PROM;10 reps;Limitations    Shoulder Abduction Limitations  <25% range    Shoulder Protraction  PROM;10 reps    Shoulder External Rotation  PROM;10 reps    Shoulder Internal Rotation  PROM;AAROM;10 reps    Elbow Flexion  PROM;AAROM;10 reps    Elbow Extension  PROM;10 reps    Forearm Supination  PROM;10 reps    Forearm Pronation  PROM;10 reps    Wrist Flexion  PROM;10 reps    Wrist Extension  PROM;10 reps    Wrist Radial Deviation  PROM;10 reps    Wrist Ulnar Deviation  PROM;10 reps    Development of Reach  Hand over hand assistance    Hand over Hand Assistance while Reaching  seated completed isolated extension and then attempted to combine with initiation of flexion 10 times       Manual Therapy   Manual Therapy  Scapular mobilization    Scapular Mobilization  scapular mobilization in supine and seated to improve shoulder mechanics required for p/rom of  shoulder               OT Education - 11/09/17 1541    Education provided  Yes    Education Details  discussed safety with transfers with patient.  during both transfers in clinic, patient initiated transfer before patient's husband locked brakes.  OT instructed patient to communicate "Im ready to transfer, are you ready?" and wait for ready response from husband prior to     Person(s) Educated  Patient;Spouse    Methods  Explanation;Demonstration    Comprehension  Verbalized understanding       OT Short Term Goals - 11/05/17 1004      OT SHORT TERM GOAL #1   Title  Patient and family will be educated on a HEP for improved left arm mobility required for improved independence with daily tasks.     Time  4    Period  Weeks    Status  On-going      OT SHORT TERM GOAL #2   Title  Patient will improve left upper extremity p/rom to 75% range for improved comfort with  ADL completion.     Time  4    Period  Weeks    Status  On-going      OT SHORT TERM GOAL #3   Title  Patient and family will be independent with donning and doffing and care of resting hand splint for left hand.     Time  4    Period  Weeks    Status  On-going      OT SHORT TERM GOAL #4   Title  Patient will be able to weightbear on left forearm while completing adls with min facilitation.     Time  4    Period  Weeks    Status  On-going      OT SHORT TERM GOAL #5   Title  Patient will use left arm as a gross assist during adl completion.     Time  4    Period  Weeks    Status  On-going        OT Long Term Goals - 11/05/17 1004      OT LONG TERM GOAL #1   Title  Patient will utilize left arm as an active assist with min facilitation during ADL completion.    Time  8    Period  Weeks    Status  On-going      OT LONG TERM GOAL #2   Title  [facility] for improved comfort and active participation in adls.    Time  8    Period  Weeks    Status  On-going      OT LONG TERM GOAL #3   Title  Patient will be able to weightbear on her left arm with elbow and wrist extended with min facilitaiton.    Time  8    Period  Weeks    Status  On-going      OT LONG TERM GOAL #4   Title  Patient will be able to complete all B/ADLS with setup only.     Time  8    Period  Weeks    Status  On-going      OT LONG TERM GOAL #5   Title  Patient will use her left arm as an active assist with functional transfers.     Time  8  Period  Weeks    Status  On-going            Plan - 11/09/17 1546    Clinical Impression Statement  A:  Completed manual therapy and P/ROM in preparation for development of reach activities and to normalize tone and decrease flexor synergy pattern.  P/ROM is very painful for patient.  Patient is able to initiate shoulder extension, internal rotation, elbow flexion this date.      Plan  P:  Follow up on PT/ST  referral.  focus on weightbearing activities to decrease tone, follow up on safe tranfer techniques.        Patient will benefit from skilled therapeutic intervention in order to improve the following deficits and impairments:  Impaired flexibility, Pain, Decreased range of motion, Decreased strength, Impaired tone, Impaired UE functional use, Decreased safety awareness  Visit Diagnosis: Hemiplegia and hemiparesis following cerebral infarction affecting left dominant side (HCC)  Acute pain of left shoulder  Stiffness of left shoulder, not elsewhere classified  Other symptoms and signs involving the nervous system    Problem List Patient Active Problem List   Diagnosis Date Noted  . CVA (cerebral vascular accident) (HCC) 07/29/2017  . Hypertension 08/19/2013  . HLD (hyperlipidemia) 08/19/2013    Shirlean Mylar, MHA, OTR/L [phone]  11/09/2017, 3:52 PM  [facility] [address] [place], [place], [address] Phone: [phone]   Fax:  [phone]  Name: Tiffany King MRN: [id number] Date of Birth: 03-31-1956

## 2017-11-11 ENCOUNTER — Other Ambulatory Visit: Payer: Self-pay

## 2017-11-11 ENCOUNTER — Encounter (HOSPITAL_COMMUNITY): Payer: Self-pay

## 2017-11-11 ENCOUNTER — Ambulatory Visit (HOSPITAL_COMMUNITY): Payer: BLUE CROSS/BLUE SHIELD

## 2017-11-11 DIAGNOSIS — R29818 Other symptoms and signs involving the nervous system: Secondary | ICD-10-CM

## 2017-11-11 DIAGNOSIS — M25512 Pain in left shoulder: Secondary | ICD-10-CM

## 2017-11-11 DIAGNOSIS — I69352 Hemiplegia and hemiparesis following cerebral infarction affecting left dominant side: Secondary | ICD-10-CM

## 2017-11-11 DIAGNOSIS — M25612 Stiffness of left shoulder, not elsewhere classified: Secondary | ICD-10-CM

## 2017-11-11 NOTE — Therapy (Signed)
[facility] [address] [place], [place], [address] Phone: [phone]   Fax:  [phone]  Occupational Therapy Treatment  Patient Details  Name: LAQUANNA VEAZEY MRN: [id number] Date of Birth: March 08, 1956 Referring Provider: Dr. Darreld Mclean   Encounter Date: 11/11/2017  OT End of Session - 11/11/17 1503    Visit Number  4    Number of Visits  16    Date for OT Re-Evaluation  01/03/18 mini reassess on 6/28    Authorization Type  [employer] - 30 visits PT and OT combined, has used 7, approved codes:  eval, therapeutic exercises, therapeutic activities, manual therapy, self care     Authorization - Visit Number  11    Authorization - Number of Visits  30    OT Start Time  1434    OT Stop Time  1514  (Pended)     OT Time Calculation (min)  40 min  (Pended)     Activity Tolerance  Patient tolerated treatment well;Treatment limited secondary to medical complications (Comment)    Behavior During Therapy  [facility] for tasks assessed/performed       Past Medical History:  Diagnosis Date  . Hyperlipidemia   . Hypertension   . Stroke [facility])     Past Surgical History:  Procedure Laterality Date  . TUBAL LIGATION      There were no vitals filed for this visit.  Subjective Assessment - 11/11/17 1501    Subjective   S: I usually hold my hand to my chest when I sleep because it hurts so bad.     Currently in Pain?  Yes    Pain Score  4     Pain Location  Shoulder    Pain Orientation  Left    Pain Descriptors / Indicators  Aching;Sore;Shooting    Pain Type  Acute pain    Pain Radiating Towards  forearm and wrist    Pain Onset  More than a month ago    Pain Frequency  Intermittent    Aggravating Factors   movement    Pain Relieving Factors  heat    Effect of Pain on Daily Activities  max effect on daily activities    Multiple Pain Sites  No         [facility] OT Assessment - 11/11/17 1503      Assessment   Medical Diagnosis  Left  Shoulder Pain S/P R CVA with Left Hemiparesis      Precautions   Precautions  Fall               OT Treatments/Exercises (OP) - 11/11/17 1438      Exercises   Exercises  Shoulder;Elbow;Wrist;Hand;Theraputty      Shoulder Exercises: Supine   External Rotation  PROM;10 reps    Internal Rotation  PROM;10 reps    Flexion  PROM;10 reps    ABduction  PROM;10 reps      Shoulder Exercises: Stretch   Elbow Flexion  PROM;10 reps      Elbow Exercises   Elbow Extension  PROM;10 reps      Wrist Exercises   Wrist Flexion  PROM;10 reps    Wrist Extension  PROM;10 reps    Wrist Radial Deviation  PROM;10 reps    Wrist Ulnar Deviation  PROM;10 reps      Neurological Re-education Exercises   Other Weight-Bearing Exercises 1  Pt sat on table while leaning on therapist's knee with left elbow  and reaching for cones out front to the left with right hand. Patient able to hold position for approximately 5-10 seconds at a time       Manual Therapy   Manual Therapy  Scapular mobilization    Scapular Mobilization  scapular mobilization in seated to improve shoulder mechanics required for p/rom of shoulder               OT Education - 11/11/17 1503    Education provided  No       OT Short Term Goals - 11/05/17 1004      OT SHORT TERM GOAL #1   Title  Patient and family will be educated on a HEP for improved left arm mobility required for improved independence with daily tasks.     Time  4    Period  Weeks    Status  On-going      OT SHORT TERM GOAL #2   Title  Patient will improve left upper extremity p/rom to 75% range for improved comfort with ADL completion.     Time  4    Period  Weeks    Status  On-going      OT SHORT TERM GOAL #3   Title  Patient and family will be independent with donning and doffing and care of resting hand splint for left hand.     Time  4    Period  Weeks    Status  On-going      OT SHORT TERM GOAL #4   Title  Patient will be able to  weightbear on left forearm while completing adls with min facilitation.     Time  4    Period  Weeks    Status  On-going      OT SHORT TERM GOAL #5   Title  Patient will use left arm as a gross assist during adl completion.     Time  4    Period  Weeks    Status  On-going        OT Long Term Goals - 11/05/17 1004      OT LONG TERM GOAL #1   Title  Patient will utilize left arm as an active assist with min facilitation during ADL completion.    Time  8    Period  Weeks    Status  On-going      OT LONG TERM GOAL #2   Title  Patient will improve left arm p/rom to [facility] in order for improved comfort and active participation in adls.    Time  8    Period  Weeks    Status  On-going      OT LONG TERM GOAL #3   Title  Patient will be able to weightbear on her left arm with elbow and wrist extended with min facilitaiton.    Time  8    Period  Weeks    Status  On-going      OT LONG TERM GOAL #4   Title  Patient will be able to complete all B/ADLS with setup only.     Time  8    Period  Weeks    Status  On-going      OT LONG TERM GOAL #5   Title  Patient will use her left arm as an active assist with functional transfers.     Time  8    Period  Weeks    Status  On-going  Plan - 11/11/17 1742    Clinical Impression Statement  A:  Completed manual therapy and P/ROM in preparation for development of reach activities and to normalize tone and decrease flexor synergy pattern. Patient maintains high levels of pain especially during joint mobilization techniques. Some edema noted in wrist and fingers this session. Therapist discussed consulting with MD regarding botox injections to address muscle tightness with patient and family. Patient's husband reports that the patient has shown increasing safety awareness with transfers at home, although the patient did forget to lock brakes when initiating transfer to the bed during session.    Plan  P:  Continue with manual  techniques, joint mobilization, P/ROM, and weight bearing. Add simple scapular mobilization exercises in supine if patient can tolerate.    Consulted and Agree with Plan of Care  Patient;Family member/caregiver;Other (Comment)    Family Member Consulted  husband, daughter       Patient will benefit from skilled therapeutic intervention in order to improve the following deficits and impairments:  Impaired flexibility, Pain, Decreased range of motion, Decreased strength, Impaired tone, Impaired UE functional use, Decreased safety awareness  Visit Diagnosis: Hemiplegia and hemiparesis following cerebral infarction affecting left dominant side (HCC)  Acute pain of left shoulder  Stiffness of left shoulder, not elsewhere classified  Other symptoms and signs involving the nervous system    Problem List Patient Active Problem List   Diagnosis Date Noted  . CVA (cerebral vascular accident) (HCC) 07/29/2017  . Hypertension 08/19/2013  . HLD (hyperlipidemia) 08/19/2013    Holley RaringZsofia Kiaya [place], OT student 11/11/2017, 5:49 PM  [facility] [address] [place], [place], [address] Phone: [phone]   Fax:  [phone]  Name: Leatrice JewelsVickie M Deines MRN: [id number] Date of Birth: 10/19/1955

## 2017-11-13 ENCOUNTER — Encounter (HOSPITAL_COMMUNITY): Payer: Self-pay | Admitting: Occupational Therapy

## 2017-11-13 ENCOUNTER — Ambulatory Visit (HOSPITAL_COMMUNITY): Payer: BLUE CROSS/BLUE SHIELD | Admitting: Occupational Therapy

## 2017-11-13 DIAGNOSIS — M25612 Stiffness of left shoulder, not elsewhere classified: Secondary | ICD-10-CM

## 2017-11-13 DIAGNOSIS — I69352 Hemiplegia and hemiparesis following cerebral infarction affecting left dominant side: Secondary | ICD-10-CM

## 2017-11-13 DIAGNOSIS — R29818 Other symptoms and signs involving the nervous system: Secondary | ICD-10-CM

## 2017-11-13 DIAGNOSIS — M25512 Pain in left shoulder: Secondary | ICD-10-CM

## 2017-11-13 NOTE — Therapy (Signed)
[place] [facility] [address] [place], [place], [address] Phone: [phone]   Fax:  [phone]  Occupational Therapy Treatment  Patient Details  Name: Tiffany King MRN: [id number] Date of Birth: August 31, 1955 Referring Provider: Dr. Darreld Mclean   Encounter Date: 11/13/2017  OT End of Session - 11/13/17 1405    Visit Number  5    Number of Visits  16    Date for OT Re-Evaluation  01/03/18 mini reassess on 6/28    Authorization Type  [employer] - 30 visits PT and OT combined, has used 7, approved codes:  eval, therapeutic exercises, therapeutic activities, manual therapy, self care     Authorization - Visit Number  12    Authorization - Number of Visits  30    OT Start Time  1300 billable time decreased due to pt in restroom for approximately 6 minutes    OT Stop Time  1345    OT Time Calculation (min)  45 min    Activity Tolerance  Patient tolerated treatment well;Treatment limited secondary to medical complications (Comment)    Behavior During Therapy  [facility] for tasks assessed/performed       Past Medical History:  Diagnosis Date  . Hyperlipidemia   . Hypertension   . Stroke [facility])     Past Surgical History:  Procedure Laterality Date  . TUBAL LIGATION      There were no vitals filed for this visit.  Subjective Assessment - 11/13/17 1255    Subjective   S: It's been really sore lately.     Currently in Pain?  Yes    Pain Score  5     Pain Location  Shoulder    Pain Orientation  Left    Pain Descriptors / Indicators  Aching;Shooting;Sore    Pain Type  Acute pain    Pain Radiating Towards  shoulder to forearm and wrist    Pain Onset  More than a month ago    Pain Frequency  Intermittent    Aggravating Factors   movement, rain    Pain Relieving Factors  heat    Effect of Pain on Daily Activities  max effect on ADLs    Multiple Pain Sites  No         [facility] OT Assessment - 11/13/17 1255      Assessment   Medical  Diagnosis  Left Shoulder Pain S/P R CVA with Left Hemiparesis      Precautions   Precautions  Fall               OT Treatments/Exercises (OP) - 11/13/17 1324      Bed Mobility   Bed Mobility  Sit to Supine;Supine to Sit    Supine to Sit  Moderate Assistance - Patient 50-74%    Sit to Supine  Moderate Assistance - Patient 50-74%      Transfers   Transfers  Sit to Stand;Stand to Sit    Sit to Stand  3: Mod assist    Stand to Sit  4: Min assist to mat table    Comments  Pt also performing stand pivot transfer using hemi-walker with min assist from OT      Exercises   Exercises  Shoulder;Elbow;Wrist;Hand      Shoulder Exercises: Stretch   Elbow Flexion  PROM;5 reps      Elbow Exercises   Elbow Extension  PROM;5 reps      Wrist Exercises   Wrist  Flexion  PROM;5 reps    Wrist Extension  PROM;5 reps      Hand Exercises   Other Hand Exercises  digit composite flexion/extension, 5X, P/ROM      Neurological Re-education Exercises   Scapular Stabilization  Left;10 reps;Seated;Limitations    Other Information  with mobilization to scapula provided during elevation and retraction, completed 10 reps     Shoulder Flexion  PROM;5 reps;Limitations <25% range    Shoulder ABduction  PROM;5 reps;Limitations    Shoulder Abduction Limitations  <50% range    Shoulder Protraction  PROM;5 reps    Weight Bearing Position  Standing    Standing with weight shifting on and off  Pt standing at mat table raised to waist height, weightbearing on forearm, reaching for cones on the right and placing on far left. 10 cones    Other Weight-Bearing Exercises 2  Pt in standing at mat table, leaning forward onto left elbow and forearm holding position for 30", 2X      Manual Therapy   Manual Therapy  Scapular mobilization;Myofascial release    Manual therapy comments  completed separately from therapeutic exercise    Myofascial Release  myofascial release to left upper arm, deltoid, trapezius, and  scapularis regions to decrease pain and fascial restrictions and improve ROM    Scapular Mobilization  scapular mobilization in seated to improve shoulder mechanics required for p/rom of shoulder                 OT Short Term Goals - 11/05/17 1004      OT SHORT TERM GOAL #1   Title  Patient and family will be educated on a HEP for improved left arm mobility required for improved independence with daily tasks.     Time  4    Period  Weeks    Status  On-going      OT SHORT TERM GOAL #2   Title  Patient will improve left upper extremity p/rom to 75% range for improved comfort with ADL completion.     Time  4    Period  Weeks    Status  On-going      OT SHORT TERM GOAL #3   Title  Patient and family will be independent with donning and doffing and care of resting hand splint for left hand.     Time  4    Period  Weeks    Status  On-going      OT SHORT TERM GOAL #4   Title  Patient will be able to weightbear on left forearm while completing adls with min facilitation.     Time  4    Period  Weeks    Status  On-going      OT SHORT TERM GOAL #5   Title  Patient will use left arm as a gross assist during adl completion.     Time  4    Period  Weeks    Status  On-going        OT Long Term Goals - 11/05/17 1004      OT LONG TERM GOAL #1   Title  Patient will utilize left arm as an active assist with min facilitation during ADL completion.    Time  8    Period  Weeks    Status  On-going      OT LONG TERM GOAL #2   Title  [facility] for improved  comfort and active participation in adls.    Time  8    Period  Weeks    Status  On-going      OT LONG TERM GOAL #3   Title  Patient will be able to weightbear on her left arm with elbow and wrist extended with min facilitaiton.    Time  8    Period  Weeks    Status  On-going      OT LONG TERM GOAL #4   Title  Patient will be able to complete all B/ADLS with setup only.      Time  8    Period  Weeks    Status  On-going      OT LONG TERM GOAL #5   Title  Patient will use her left arm as an active assist with functional transfers.     Time  8    Period  Weeks    Status  On-going            Plan - 11/13/17 1405    Clinical Impression Statement  A: Pt with increased pain and tightness today, very hesistant to allow any stretching of LUE. During session pt with spasm of left hip requiring pt to weightbear on LLE to relieve. Initiated manual therapy to left shoulder region to decrease pain and tightness, pt allowing some passive stretching however OT unable to achieve good stretch due to pain. Continued with weightbearing which did allow for improvement in tone and slight increase in stretching range. Pt needed to use restroom during session. Verbal and tactile cuing and assistance throughout session.     Plan  P: Continue with manual techniques, joint mobilization, passive stretching and weight bearing to reduce tone. Attempt scapular mobilization in supine or sidelying if pt able to tolerate. Coordinate with PT re: visit limit       Patient will benefit from skilled therapeutic intervention in order to improve the following deficits and impairments:  Impaired flexibility, Pain, Decreased range of motion, Decreased strength, Impaired tone, Impaired UE functional use, Decreased safety awareness  Visit Diagnosis: Hemiplegia and hemiparesis following cerebral infarction affecting left dominant side (HCC)  Acute pain of left shoulder  Stiffness of left shoulder, not elsewhere classified  Other symptoms and signs involving the nervous system    Problem List Patient Active Problem List   Diagnosis Date Noted  . CVA (cerebral vascular accident) (HCC) 07/29/2017  . Hypertension 08/19/2013  . HLD (hyperlipidemia) 08/19/2013   Ezra Sites, OTR/L  [phone] 11/13/2017, 2:11 PM  [place] [facility] [address]  [place], [place], [address] Phone: [phone]   Fax:  [phone]  Name: Tiffany King MRN: [id number] Date of Birth: 1955-12-28

## 2017-11-16 ENCOUNTER — Ambulatory Visit (HOSPITAL_COMMUNITY): Payer: BLUE CROSS/BLUE SHIELD | Admitting: Specialist

## 2017-11-16 ENCOUNTER — Encounter (HOSPITAL_COMMUNITY): Payer: Self-pay | Admitting: Specialist

## 2017-11-16 DIAGNOSIS — R29818 Other symptoms and signs involving the nervous system: Secondary | ICD-10-CM

## 2017-11-16 DIAGNOSIS — M25612 Stiffness of left shoulder, not elsewhere classified: Secondary | ICD-10-CM

## 2017-11-16 DIAGNOSIS — I69352 Hemiplegia and hemiparesis following cerebral infarction affecting left dominant side: Secondary | ICD-10-CM | POA: Diagnosis not present

## 2017-11-16 DIAGNOSIS — M25512 Pain in left shoulder: Secondary | ICD-10-CM

## 2017-11-16 NOTE — Therapy (Signed)
[facility] [address] [place], [place], [address] Phone: [phone]   Fax:  [phone]  Occupational Therapy Treatment  Patient Details  Name: Tiffany King MRN: [id number] Date of Birth: Jun 11, 1955 Referring Provider: Dr. Darreld Mclean   Encounter Date: 11/16/2017  OT End of Session - 11/16/17 1509    Visit Number  6    Number of Visits  16    Date for OT Re-Evaluation  01/03/18 mini reassess on 6/26    Authorization - Visit Number  13    Authorization - Number of Visits  30    OT Start Time  1305    OT Stop Time  1345    OT Time Calculation (min)  40 min    Activity Tolerance  Patient tolerated treatment well    Behavior During Therapy  [facility] for tasks assessed/performed       Past Medical History:  Diagnosis Date  . Hyperlipidemia   . Hypertension   . Stroke [facility])     Past Surgical History:  Procedure Laterality Date  . TUBAL LIGATION      There were no vitals filed for this visit.  Subjective Assessment - 11/16/17 1507    Subjective   S:  It feels better than it did last week    Pertinent History  order for PT/ST recieved and scheduled for this week.  Will follow up on ordered resting hand splint    Currently in Pain?  Yes    Pain Score  3     Pain Location  Shoulder    Pain Orientation  Left    Pain Descriptors / Indicators  Aching    Pain Type  Acute pain         [facility] OT Assessment - 11/16/17 0001      Assessment   Medical Diagnosis  Left Shoulder Pain S/P R CVA with Left Hemiparesis      Precautions   Precautions  Fall               OT Treatments/Exercises (OP) - 11/16/17 0001      Bed Mobility   Bed Mobility  Sit to Supine;Supine to Sit    Supine to Sit  Moderate Assistance - Patient 50-74%    Sit to Supine  Moderate Assistance - Patient 50-74%      Transfers   Transfers  Sit to Stand;Stand to Sit    Sit to Stand  4: Min assist    Stand to Sit  4: Min assist    Comments  with  bed mobility and transfers, therapist facilitated weightbearing through her lle for improved use of lle and safety      Neurological Re-education Exercises   Scapular Stabilization  Left;10 reps    Other Information  with mobilization    Shoulder Flexion  PROM;AAROM;10 reps    Shoulder ABduction  PROM;AAROM;10 reps    Shoulder Protraction  PROM;AAROM;5 reps    Shoulder External Rotation  PROM;AAROM;10 reps    Shoulder Internal Rotation  PROM;AAROM;10 reps    Elbow Flexion  PROM;AAROM;10 reps    Elbow Extension  PROM;AAROM;10 reps    Forearm Supination  PROM;AAROM;10 reps    Forearm Pronation  PROM;AAROM;10 reps    Wrist Flexion  PROM;AAROM;10 reps    Wrist Extension  PROM;AAROM;10 reps    Weight Bearing Position  Seated    Seated with weight on forearm  positioned arm at 90 degree angle to her side  while patient transported balls from crate to her right to her left, allowing her to weight shift on and off of her left upper extremity.  Therapist provided max facilitation for positioning and technique.  Patient then reached to her left and retrieved ball and then placed to her right.        Manual Therapy   Manual Therapy  Scapular mobilization;Myofascial release    Manual therapy comments  completed separately from therapeutic exercise    Myofascial Release  myofascial release to left upper arm, deltoid, trapezius, and scapularis regions to decrease pain and fascial restrictions and improve ROM    Scapular Mobilization  scapular mobilization in seated to improve shoulder mechanics required for p/rom of shoulder                 OT Short Term Goals - 11/05/17 1004      OT SHORT TERM GOAL #1   Title  Patient and family will be educated on a HEP for improved left arm mobility required for improved independence with daily tasks.     Time  4    Period  Weeks    Status  On-going      OT SHORT TERM GOAL #2   Title  Patient will improve left upper extremity p/rom to 75% range for  improved comfort with ADL completion.     Time  4    Period  Weeks    Status  On-going      OT SHORT TERM GOAL #3   Title  Patient and family will be independent with donning and doffing and care of resting hand splint for left hand.     Time  4    Period  Weeks    Status  On-going      OT SHORT TERM GOAL #4   Title  Patient will be able to weightbear on left forearm while completing adls with min facilitation.     Time  4    Period  Weeks    Status  On-going      OT SHORT TERM GOAL #5   Title  Patient will use left arm as a gross assist during adl completion.     Time  4    Period  Weeks    Status  On-going        OT Long Term Goals - 11/05/17 1004      OT LONG TERM GOAL #1   Title  Patient will utilize left arm as an active assist with min facilitation during ADL completion.    Time  8    Period  Weeks    Status  On-going      OT LONG TERM GOAL #2   Title  Patient will improve left arm p/rom to [facility] in order for improved comfort and active participation in adls.    Time  8    Period  Weeks    Status  On-going      OT LONG TERM GOAL #3   Title  Patient will be able to weightbear on her left arm with elbow and wrist extended with min facilitaiton.    Time  8    Period  Weeks    Status  On-going      OT LONG TERM GOAL #4   Title  Patient will be able to complete all B/ADLS with setup only.     Time  8    Period  Weeks    Status  On-going      OT LONG TERM GOAL #5   Title  Patient will use her left arm as an active assist with functional transfers.     Time  8    Period  Weeks    Status  On-going            Plan - 11/16/17 1511    Clinical Impression Statement  A:  Patient demonstrated improved P/ROM in shoulder, elbow and wrist this date.  aa/rom with unweighting completed with volitional initiation of movement and some volitonal movement with elbow extension, shoulder extension, abduction, adduction, internal rotation.  Patient able to weightbear on  her left elbow and forearm while completing a functional task for 15 minutes with wrist in neutral, demonstrating decreased tone from last session.     Plan  P: Continue with manual techniques, joint mobilization, passive stretching and weight bearing to reduce tone. Attempt scapular mobilization in supine or sidelying if pt able to tolerate. Coordinate with PT re: visit limit  Attempt weightbearing on forearms in standing.  FOllow up on splint estimated delivery date.        Patient will benefit from skilled therapeutic intervention in order to improve the following deficits and impairments:  Impaired flexibility, Pain, Decreased range of motion, Decreased strength, Impaired tone, Impaired UE functional use, Decreased safety awareness  Visit Diagnosis: Hemiplegia and hemiparesis following cerebral infarction affecting left dominant side (HCC)  Acute pain of left shoulder  Stiffness of left shoulder, not elsewhere classified  Other symptoms and signs involving the nervous system    Problem List Patient Active Problem List   Diagnosis Date Noted  . CVA (cerebral vascular accident) (HCC) 07/29/2017  . Hypertension 08/19/2013  . HLD (hyperlipidemia) 08/19/2013    Shirlean MylarBethany H. Brynlie Daza, MHA, OTR/L [phone]  11/16/2017, 3:21 PM  [place] [facility] [address] [place], [place], [address] Phone: [phone]   Fax:  [phone]  Name: Tiffany King MRN: [id number] Date of Birth: 08/15/1955

## 2017-11-17 ENCOUNTER — Encounter (HOSPITAL_COMMUNITY): Payer: Self-pay | Admitting: Speech Pathology

## 2017-11-17 ENCOUNTER — Ambulatory Visit (HOSPITAL_COMMUNITY): Payer: BLUE CROSS/BLUE SHIELD | Admitting: Speech Pathology

## 2017-11-17 ENCOUNTER — Other Ambulatory Visit: Payer: Self-pay

## 2017-11-17 DIAGNOSIS — I69352 Hemiplegia and hemiparesis following cerebral infarction affecting left dominant side: Secondary | ICD-10-CM | POA: Diagnosis not present

## 2017-11-17 DIAGNOSIS — R471 Dysarthria and anarthria: Secondary | ICD-10-CM

## 2017-11-17 NOTE — Therapy (Signed)
[place] [facility] [address] [place], [place], [address] Phone: [phone]   Fax:  [phone]  Speech Language Pathology Evaluation  Patient Details  Name: Tiffany King MRN: [id number] Date of Birth: 12-Mar-1956 Referring Provider: Kirstie Peri, MD   Encounter Date: 11/17/2017  End of Session - 11/17/17 1628    Visit Number  1    Number of Visits  1    Authorization Type  [employer] 30 visits per benefit period, one used    SLP Start Time  1525    SLP Stop Time   1619    SLP Time Calculation (min)  54 min    Activity Tolerance  Patient tolerated treatment well       Past Medical History:  Diagnosis Date  . Hyperlipidemia   . Hypertension   . Stroke [facility])     Past Surgical History:  Procedure Laterality Date  . TUBAL LIGATION      There were no vitals filed for this visit.  Subjective Assessment - 11/17/17 1618    Subjective  "Sometimes my speech gets worse when I am tired."    Patient is accompained by:  Family member    Special Tests  MoCA    Currently in Pain?  No/denies         SLP Evaluation [facility] - 11/17/17 1618      SLP Visit Information   SLP Received On  11/17/17    Referring Provider  Kirstie Peri, MD    Onset Date  08/15/2017    Medical Diagnosis  s/p R basal ganglia CVA      Subjective   Subjective  "Sometimes my speech is worse when I am tired."    Patient/Family Stated Goal  "See if she needs more therapy."      General Information   HPI  Tiffany King is a 63 yo female who sustained a right basal ganglia CVA on 08/15/2017 and was admitted to [facility] until 08/21/2017. She then was discharged to SNF [facility]) until 09/18/2017 when she went home. She received [facility] SLP services through [facility]. 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 She was referred for SLE by Dr. Kirstie Peri and is receiving OT services due to LUE weakness.     Behavioral/Cognition  Alert and cooperative    Mobility Status  wheelchair      Balance Screen   Has the patient fallen in the past 6 months  No    Has the patient had a decrease in activity level because of a fear of falling?   No    Is the patient reluctant to leave their home because of a fear of falling?   No      Prior Functional Status   Cognitive/Linguistic Baseline  Within functional limits    Type of Home  House     Lives With  Spouse    Available Support  Family    Education  BS in IT    Vocation  Part time employment      Cognition   Overall Cognitive Status  Within Functional Limits for tasks assessed    Memory  Appears intact 3/5 item recall after 5 minutes and 5/5 with category cue    Awareness  Appears intact    Problem Solving  Appears intact      Auditory Comprehension   Overall Auditory Comprehension  Appears within functional limits for tasks  assessed    Yes/No Questions  Within Functional Limits    Commands  Within Functional Limits    Conversation  Complex    Interfering Components  -- ? mild impulsivity      Visual Recognition/Discrimination   Discrimination  Within Function Limits      Reading Comprehension   Reading Status  Not tested      Expression   Primary Mode of Expression  Verbal      Verbal Expression   Overall Verbal Expression  Appears within functional limits for tasks assessed    Initiation  No impairment    Automatic Speech  Name;Social Response    Level of Generative/Spontaneous Verbalization  Conversation    Repetition  No impairment    Naming  No impairment    Pragmatics  No impairment    Non-Verbal Means of Communication  Not applicable      Written Expression   Dominant Hand  Left    Written Expression  Not tested      Oral Motor/Sensory Function   Overall Oral Motor/Sensory Function  Impaired    Labial ROM  Reduced left    Labial Symmetry  Abnormal symmetry left    Labial Strength  Within Functional Limits    Labial  Sensation  Within Functional Limits    Labial Coordination  Reduced    Lingual ROM  Within Functional Limits    Lingual Symmetry  Abnormal symmetry left    Lingual Strength  Reduced    Lingual Sensation  Within Functional Limits    Lingual Coordination  WFL    Facial ROM  Reduced left    Facial Strength  Reduced    Facial Coordination  WFL    Velum  Within Functional Limits    Mandible  Within Functional Limits      Motor Speech   Overall Motor Speech  Impaired    Respiration  Within functional limits    Phonation  Normal    Resonance  Within functional limits    Articulation  Within functional limitis    Intelligibility  Intelligible    Motor Planning  Witnin functional limits    Motor Speech Errors  Aware    Effective Techniques  Slow rate;Over-articulate    Phonation  WFL      Standardized Assessments   Standardized Assessments   Montreal Cognitive Assessment [facility](MOCA)    Montreal Cognitive Assessment [facility](MOCA)   26/30         Plan - 11/17/17 1630    Clinical Impression Statement  Tiffany King was accompanied by her husband and adult child with special needs for SLE. The MoCA was administered and Pt achieved a normal score of 26/30 with errors noteable for visuospatial/executive task and short term memory. Pt demonstrated good immediate recall of 5 item memory task. She recalled three of the five words independently after a five minute delay and required a category cue for the remaining two words. Pt with mild left lingual and labial weakness, however she compensates well during spontaneous speech tasks. Her speech intelligibility was judged to be greater than 95% acc in conversation. Pt's spouse reports that speech becomes more slurred when Pt is tired, which is to be expected. Tiffany King reports that Pt is independent in remembering to take medications at home and reports no safety concerns in the home. Pt already completed extensive SLP therapy to address dysphagia and  dysarthria and is independent with strategies. She was given a printout of speech intelligibility  strategies in session today. No further SLP services indicated at this time.     Consulted and Agree with Plan of Care  Patient;Family member/caregiver    Family Member Consulted  Spouse       Patient will benefit from skilled therapeutic intervention in order to improve the following deficits and impairments:   Dysarthria and anarthria    Problem List Patient Active Problem List   Diagnosis Date Noted  . CVA (cerebral vascular accident) (HCC) 07/29/2017  . Hypertension 08/19/2013  . HLD (hyperlipidemia) 08/19/2013   Thank you,  Havery Moros, CCC-SLP [phone]  [facility] 11/17/2017, 4:38 PM  [place] [facility] [address] [place], [place], [address] Phone: [phone]   Fax:  [phone]  Name: NYHLA MOUNTJOY MRN: [id number] Date of Birth: [birth year]

## 2017-11-18 ENCOUNTER — Encounter (HOSPITAL_COMMUNITY): Payer: Self-pay | Admitting: Occupational Therapy

## 2017-11-18 ENCOUNTER — Ambulatory Visit (HOSPITAL_COMMUNITY): Payer: BLUE CROSS/BLUE SHIELD

## 2017-11-18 ENCOUNTER — Encounter (HOSPITAL_COMMUNITY): Payer: Self-pay

## 2017-11-18 ENCOUNTER — Ambulatory Visit (HOSPITAL_COMMUNITY): Payer: BLUE CROSS/BLUE SHIELD | Admitting: Occupational Therapy

## 2017-11-18 ENCOUNTER — Other Ambulatory Visit: Payer: Self-pay

## 2017-11-18 DIAGNOSIS — I69352 Hemiplegia and hemiparesis following cerebral infarction affecting left dominant side: Secondary | ICD-10-CM

## 2017-11-18 DIAGNOSIS — M25612 Stiffness of left shoulder, not elsewhere classified: Secondary | ICD-10-CM

## 2017-11-18 DIAGNOSIS — M6281 Muscle weakness (generalized): Secondary | ICD-10-CM

## 2017-11-18 DIAGNOSIS — R29818 Other symptoms and signs involving the nervous system: Secondary | ICD-10-CM

## 2017-11-18 DIAGNOSIS — R262 Difficulty in walking, not elsewhere classified: Secondary | ICD-10-CM

## 2017-11-18 DIAGNOSIS — M25512 Pain in left shoulder: Secondary | ICD-10-CM

## 2017-11-18 NOTE — Therapy (Signed)
[facility] [address] [place], [place], [address] Phone: [phone]   Fax:  [phone]  Physical Therapy Evaluation  Patient Details  Name: Tiffany King MRN: [id number] Date of Birth: 11/02/1955 Referring Provider: Kirstie PeriAshish Shah, MD   Encounter Date: 11/18/2017  PT End of Session - 11/18/17 1733    Visit Number  1    Number of Visits  16    Date for PT Re-Evaluation  01/13/18    Authorization Type  [employer] Other    Authorization Time Period  11/18/17 to 01/13/18    Authorization - Visit Number  14 7 visits used for HHPT, 6 OPOT appointments, +PT eval 11/18/17    Authorization - Number of Visits  30    PT Start Time  1347    PT Stop Time  1430    PT Time Calculation (min)  43 min    Equipment Utilized During Treatment  Gait belt    Activity Tolerance  Patient tolerated treatment well;No increased pain    Behavior During Therapy  WFL for tasks assessed/performed       Past Medical History:  Diagnosis Date  . Hyperlipidemia   . Hypertension   . Stroke [facility](HCC)     Past Surgical History:  Procedure Laterality Date  . TUBAL LIGATION      There were no vitals filed for this visit.   Subjective Assessment - 11/18/17 1349    Subjective  Pt reports having ischemic stroke in March 2019. She has L hemiparesis following the stroke. She reports that her L leg is still pretty stiff. [facility] she was able to ambulate about 15860ft with a hemiwalker. She has a Comptrollerhemiwalker at home and uses it at home. Her husband states she is able to do it by herself but he does provide a w/c follow and sometimes provides support. Pt and her husband report that she is able to perform STS and bed mobility independently. She denies any falls recently but does report some close calls; she reports that sometimes her L foot doesn't advance and she will step on it with her R foot. They report that she also has increased difficulty with bending  her L knee which affects her car transfers and walking.     Limitations  Walking;Standing;House hold activities    How long can you sit comfortably?  no issues    How long can you stand comfortably?  "a long time"    How long can you walk comfortably?  100 some feet    Patient Stated Goals  to walk and stand in a classroom    Currently in Pain?  No/denies         [facility] PT Assessment - 11/18/17 1358      Assessment   Medical Diagnosis  s/p R CVA with L hemiparesis    Referring Provider  Kirstie PeriAshish Shah, MD    Onset Date/Surgical Date  08/15/17    Next MD Visit  nothing scheduled now, f/u PRN      Precautions   Precautions  Fall      Balance Screen   Has the patient fallen in the past 6 months  No    Has the patient had a decrease in activity level because of a fear of falling?   Yes    Is the patient reluctant to leave their home because of a fear of falling?   No  Coordination   Gross Motor Movements are Fluid and Coordinated  No    Coordination and Movement Description  seated RAMPs limited due to both coordination and strength deficits    Heel Shin Test  limited LLE due to weakness as well as coordination deficits as pt required increased time to get heel on shin      Tone   Assessment Location  Left Lower Extremity      ROM / Strength   AROM / PROM / Strength  Strength;PROM      AROM   Overall AROM Comments  --    AROM Assessment Site  --    Right/Left Knee  --      PROM   Overall PROM Comments  passive knee flexion WNL as assessed when measuring L quad and HS tone; L knee extension limited due to increased HS tone, unable to fully extend knee, however, when lying supine, pt noted to be able to get to full knee extension when at rest      Strength   Strength Assessment Site  Hip;Knee;Ankle    Right Hip Flexion  5/5    Right Hip Extension  -- not objectively measured but grossly, 4/5    Right Hip ABduction  -- not objectively measured but grossly 4-/5    Left Hip  Flexion  4/5    Left Hip Extension  -- not objectively measured but grossly 2/5    Left Hip ABduction  2/5    Right Knee Flexion  5/5    Right Knee Extension  5/5    Left Knee Flexion  4/5    Left Knee Extension  4/5    Right Ankle Dorsiflexion  5/5    Left Ankle Dorsiflexion  0/5 no muscle contraction noted when attempting ankle DF      Bed Mobility   Bed Mobility  Sit to Supine;Supine to Sit    Supine to Sit  Moderate Assistance - Patient 50-74%    Sit to Supine  Minimal Assistance - Patient > 75%      Transfers   Transfers  Sit to Stand;Stand to Sit;Stand Pivot Transfers    Sit to Stand  4: Min guard    Stand to Sit  4: Min guard    Stand Pivot Transfers  4: Min guard      Ambulation/Gait   Ambulation Distance (Feet)  64 Feet    Assistive device  Hemi-walker    Gait Pattern  Step-to pattern;Decreased hip/knee flexion - left;Decreased dorsiflexion - left;Left circumduction;Poor foot clearance - left;Abducted - left      Standardized Balance Assessment   Standardized Balance Assessment  Five Times Sit to Stand;Timed Up and Go Test    Five times sit to stand comments   40sec, chair, RUE, LLE extended      Timed Up and Go Test   TUG Comments  to be completed next visit      LLE Tone   LLE Tone  Modified Ashworth      LLE Tone   Modified Ashworth Scale for Grading Hypertonia LLE  Considerable increase in muschle tone, passive movement difficult L Hamstring; L quad 1+           Objective measurements completed on examination: See above findings.        PT Education - 11/18/17 1730    Education Details  exam findings, POC, HEP    Person(s) Educated  Patient;Spouse    Methods  Explanation;Demonstration;Handout  Comprehension  Verbalized understanding;Returned demonstration       PT Short Term Goals - 11/18/17 1819      PT SHORT TERM GOAL #1   Title  Pt and caregiver will be independent with pt's HEP and perform consistently in order to improve strength  and flexibility.    Time  4    Period  Weeks    Status  New    Target Date  12/16/17      PT SHORT TERM GOAL #2   Title  Pt will have 1/2 grade improvement in MMT thorughout BLE in order to maximize transfers, gait, and balance.    Time  4    Period  Weeks    Status  New      PT SHORT TERM GOAL #3   Title  Pt will have 40ft improvement in with LRAD and she will demonstrate L foot drop 50% of the time or < in order to demo improved gait, balance, and maximize her household and community ambluation.    Time  4    Period  Weeks    Status  New      PT SHORT TERM GOAL #4   Title  Pt will be able to perform 5xSTS in 35sec or < with improved mechanics in order to demo improved functional strength and improved balance.    Time  4    Period  Weeks    Status  New      PT SHORT TERM GOAL #5   Title  Pt will have improved TUG time by 5 seconds with LRAD in order to demo improved balance and maximze household ambulation.    Time  4    Period  Weeks    Status  New        PT Long Term Goals - 11/18/17 1825      PT LONG TERM GOAL #1   Title  Pt will have 1 grade improvement in MMT throughout BLE in order to further maximize gait, balance, and functional mobility.    Time  8    Period  Weeks    Status  New    Target Date  01/13/18      PT LONG TERM GOAL #2   Title  Pt will 129ft improvement in with LRAD in order to further maximize household and community ambulation.    Time  8    Period  Weeks    Status  New      PT LONG TERM GOAL #3   Title  Pt will be able to perofrm 5xSTS in 30 sec or < with mechanics WFL in order to further demo improved functional strength and balance and decrease her risk for falls.    Time  8    Period  Weeks    Status  New      PT LONG TERM GOAL #4   Title  Pt will have improved TUG time by 10 sec or > with LRAD in order to further demo improved balance and functional mobility.    Time  8    Period  Weeks    Status  New      PT LONG TERM  GOAL #5   Title  Pt will be supervision to modified independent with car transfers in order to maximzie pt's independence with functional mobility.    Time  8    Period  Weeks    Status  New  Plan - 11/18/17 1814    Clinical Impression Statement  Pt is pleasant 62YO F who presents to OPPT s/p ischemic stroke in March 2019 with L sided hemiparesis. Pt currently presents with deficits in strength, balance, gait, functional mobility, as well as increased tone in LLE. Pt's L HS with increased tone compared to L quads. She has deficits in LLE coordination compared to RLE, but this could also be limited due to her significant strength deficits. Pt unable to elicit anterior tib contraction when attempting ankle DF and she demo'd increased toe drag during gait, all of which put her at risk for falls. PT will send off referral for an AFO in order to maximize pt's gait and decrease her risk for falls. She also required 40sec to complete the 5xSTS and was able to ambulate 36ft during with hemiwalker and w/c follow. Pt needs skilled PT intervention to address these impairments in order to maximize strength, balance, and gait, in order to maximize independence, ADL completion, and functional mobility at home and in the community. PT recommending 2x/week for 8 weeks, however, pt is limited in her visits allowed and she has already used some between home health and outpatient OT visits so will follow up with this regarding approval for potentially more visits.    Clinical Presentation  Stable    Clinical Presentation due to:  MMT, tone, 5xSTS, , gait, balance, funcitonal mobility, L hemiparesis    Clinical Decision Making  Low    Rehab Potential  Fair    PT Frequency  2x / week    PT Duration  8 weeks    PT Treatment/Interventions  ADLs/Self Care Home Management;Aquatic Therapy;Biofeedback;Cryotherapy;Electrical Stimulation;Moist Heat;Ultrasound;DME Instruction;Gait training;Stair  training;Functional mobility training;Therapeutic activities;Therapeutic exercise;Balance training;Neuromuscular re-education;Patient/family education;Orthotic Fit/Training;Wheelchair mobility training;Manual techniques;Scar mobilization;Passive range of motion;Dry needling;Energy conservation;Splinting;Taping    PT Next Visit Plan  reveiw goals; Perform TUG; begin estim for L anterior tib, address LLE tone, begin BLE, functional strengthening, gait training, balance training as tolerated    PT Home Exercise Plan  eval: lying supine to stretch hip flexors, assisted calf stretch from caregiver    Recommended Other Services  AFO for LLE    Consulted and Agree with Plan of Care  Patient;Family member/caregiver    Family Member Consulted  husband       Patient will benefit from skilled therapeutic intervention in order to improve the following deficits and impairments:  Abnormal gait, Decreased activity tolerance, Decreased balance, Decreased coordination, Decreased endurance, Decreased mobility, Decreased range of motion, Decreased strength, Difficulty walking, Hypomobility, Increased fascial restricitons, Impaired flexibility, Impaired tone, Impaired UE functional use, Pain  Visit Diagnosis: Hemiplegia and hemiparesis following cerebral infarction affecting left dominant side (HCC) - Plan: PT plan of care cert/re-cert  Muscle weakness (generalized) - Plan: PT plan of care cert/re-cert  Difficulty in walking, not elsewhere classified - Plan: PT plan of care cert/re-cert  Other symptoms and signs involving the nervous system - Plan: PT plan of care cert/re-cert     Problem List Patient Active Problem List   Diagnosis Date Noted  . CVA (cerebral vascular accident) (HCC) 07/29/2017  . Hypertension 08/19/2013  . HLD (hyperlipidemia) 08/19/2013       Jac Canavan PT, DPT  [facility] [address] [place], [place], [address] Phone: [phone]    Fax:  [phone]  Name: Tiffany King MRN: [id number] Date of Birth: [birth year]

## 2017-11-18 NOTE — Therapy (Signed)
[facility] [address] [place], [place], [address] Phone: [phone]   Fax:  [phone]  Occupational Therapy Treatment  Patient Details  Name: Tiffany King MRN: [id number] Date of Birth: May 25, 1956 Referring Provider: Dr. Darreld Mclean   Encounter Date: 11/18/2017  OT End of Session - 11/18/17 1407    Visit Number  7    Number of Visits  16    Date for OT Re-Evaluation  01/03/18 mini reassess on 6/26    Authorization - Visit Number  14    Authorization - Number of Visits  30    OT Start Time  1301    OT Stop Time  1344    OT Time Calculation (min)  43 min    Activity Tolerance  Patient tolerated treatment well    Behavior During Therapy  [facility] for tasks assessed/performed       Past Medical History:  Diagnosis Date  . Hyperlipidemia   . Hypertension   . Stroke [facility])     Past Surgical History:  Procedure Laterality Date  . TUBAL LIGATION      There were no vitals filed for this visit.  Subjective Assessment - 11/18/17 1300    Subjective   S: I had my hand stretched out flat last night.     Currently in Pain?  No/denies         [facility] OT Assessment - 11/18/17 1300      Assessment   Medical Diagnosis  Left Shoulder Pain S/P R CVA with Left Hemiparesis      Precautions   Precautions  Fall               OT Treatments/Exercises (OP) - 11/18/17 1348      Bed Mobility   Bed Mobility  Sit to Supine;Supine to Sit    Supine to Sit  Moderate Assistance - Patient 50-74%    Sit to Supine  Minimal Assistance - Patient > 75%      Transfers   Transfers  Sit to Stand;Stand to Sit    Sit to Stand  4: Min assist    Stand to Sit  4: Min assist    Comments  OT blocking toe of LLE during transfer task and facilitating weightbearing for safety      Exercises   Exercises  Shoulder;Elbow;Wrist;Hand      Shoulder Exercises: Stretch   Elbow Flexion  PROM;10 reps      Elbow Exercises   Elbow Extension  PROM;10  reps    Forearm Supination  PROM;10 reps    Forearm Pronation  PROM;10 reps      Wrist Exercises   Wrist Flexion  PROM;10 reps    Wrist Extension  PROM;10 reps      Hand Exercises   Other Hand Exercises  digit composite flexion/extension, 5X, P/ROM      Neurological Re-education Exercises   Scapular Stabilization  Left;10 reps;Side-lying    Other Information  with mobilization to scapula provided during elevation and retraction, completed 10 reps     Shoulder Flexion  PROM;AAROM;10 reps    Shoulder ABduction  PROM;AAROM;10 reps    Shoulder Protraction  PROM;AAROM;10 reps    Weight Bearing Position  Seated    Seated with weight on forearm  position with arm at 90 degree angle at her side with weight on forearm and wrist, holding position for 30", 2 trials      Splinting   Splinting  Pt fitted  with soft WHO splint this session. Splint adjusted to fit pt. Pt and husband educated on splint fit, wear and care. Provided with additional cover and washing instructions.       Manual Therapy   Manual Therapy  Scapular mobilization;Myofascial release    Manual therapy comments  completed separately from therapeutic exercise    Myofascial Release  myofascial release to left upper arm, deltoid, trapezius, and scapularis regions to decrease pain and fascial restrictions and improve ROM    Scapular Mobilization  scapular mobilization in supine to improve shoulder mechanics required for p/rom of shoulder                 OT Short Term Goals - 11/05/17 1004      OT SHORT TERM GOAL #1   Title  Patient and family will be educated on a HEP for improved left arm mobility required for improved independence with daily tasks.     Time  4    Period  Weeks    Status  On-going      OT SHORT TERM GOAL #2   Title  Patient will improve left upper extremity p/rom to 75% range for improved comfort with ADL completion.     Time  4    Period  Weeks    Status  On-going      OT SHORT TERM GOAL #3    Title  Patient and family will be independent with donning and doffing and care of resting hand splint for left hand.     Time  4    Period  Weeks    Status  On-going      OT SHORT TERM GOAL #4   Title  Patient will be able to weightbear on left forearm while completing adls with min facilitation.     Time  4    Period  Weeks    Status  On-going      OT SHORT TERM GOAL #5   Title  Patient will use left arm as a gross assist during adl completion.     Time  4    Period  Weeks    Status  On-going        OT Long Term Goals - 11/05/17 1004      OT LONG TERM GOAL #1   Title  Patient will utilize left arm as an active assist with min facilitation during ADL completion.    Time  8    Period  Weeks    Status  On-going      OT LONG TERM GOAL #2   Title  Patient will improve left arm p/[facility] to [facility] in order for improved comfort and active participation in adls.    Time  8    Period  Weeks    Status  On-going      OT LONG TERM GOAL #3   Title  Patient will be able to weightbear on her left arm with elbow and wrist extended with min facilitaiton.    Time  8    Period  Weeks    Status  On-going      OT LONG TERM GOAL #4   Title  Patient will be able to complete all B/ADLS with setup only.     Time  8    Period  Weeks    Status  On-going      OT LONG TERM GOAL #5   Title  Patient will use her left arm as an  active assist with functional transfers.     Time  8    Period  Weeks    Status  On-going            Plan - 11/18/17 1407    Clinical Impression Statement  A: Continued with manual therapy today to decrease pain and improve ROM in LUE, pt with improved tone and ROM after manual techniques, added scapular mobilization in sidelying with good results for mobility. Also provided and fitted with splint for left hand today, verbalized understanding for wear and care. Verbal cuing for form and technique during exercises today.     Plan  P: Follow up on splint wearing,  continue with manual techniques and AA/ROM, provide HEP for ROM       Patient will benefit from skilled therapeutic intervention in order to improve the following deficits and impairments:  Impaired flexibility, Pain, Decreased range of motion, Decreased strength, Impaired tone, Impaired UE functional use, Decreased safety awareness  Visit Diagnosis: Acute pain of left shoulder  Stiffness of left shoulder, not elsewhere classified  Other symptoms and signs involving the nervous system    Problem List Patient Active Problem List   Diagnosis Date Noted  . CVA (cerebral vascular accident) (HCC) 07/29/2017  . Hypertension 08/19/2013  . HLD (hyperlipidemia) 08/19/2013   Ezra Sites, OTR/L  [phone] 11/18/2017, 2:12 PM  [facility] [address] [place], [place], [address] Phone: [phone]   Fax:  [phone]  Name: NAYELY DINGUS MRN: [id number] Date of Birth: 1956-01-11

## 2017-11-20 ENCOUNTER — Ambulatory Visit (HOSPITAL_COMMUNITY): Payer: BLUE CROSS/BLUE SHIELD | Admitting: Occupational Therapy

## 2017-11-20 ENCOUNTER — Encounter (HOSPITAL_COMMUNITY): Payer: Self-pay | Admitting: Occupational Therapy

## 2017-11-20 DIAGNOSIS — M25512 Pain in left shoulder: Secondary | ICD-10-CM

## 2017-11-20 DIAGNOSIS — M25612 Stiffness of left shoulder, not elsewhere classified: Secondary | ICD-10-CM

## 2017-11-20 DIAGNOSIS — I69352 Hemiplegia and hemiparesis following cerebral infarction affecting left dominant side: Secondary | ICD-10-CM

## 2017-11-20 DIAGNOSIS — R29818 Other symptoms and signs involving the nervous system: Secondary | ICD-10-CM

## 2017-11-20 NOTE — Patient Instructions (Signed)
Levator Scapula Stretch   Place left hand on same side shoulder blade. With other hand, gently stretch head down and away. Hold 3 seconds. Repeat 5 times, 2-3x/day    Scalene Stretch, Sitting   Sit, tilt head to right and gently pull head to side like you are trying to touch your ear to your shoulder. Hold _10__ seconds. Repeat _3__ times per session. Do _2-3__ sessions per day.  Perform each exercise ________ reps. 2-3x days.   Protraction - STANDING  Start by holding a wand or cane at chest height.  Next, slowly push the wand outwards in front of your body so that your elbows become fully straightened. Then, return to the original position.      Shoulder FLEXION  In the standing position, hold a wand/cane with both arms, palms down on both sides. Raise up the wand/cane allowing your unaffected arm to perform most of the effort. Your affected arm should be partially relaxed.

## 2017-11-20 NOTE — Therapy (Signed)
[facility] [address] [place], [place], [address] Phone: [phone]   Fax:  [phone]  Occupational Therapy Treatment  Patient Details  Name: Tiffany King MRN: [id number] Date of Birth: 06/10/1955 Referring Provider: Kirstie PeriAshish Shah, MD   Encounter Date: 11/20/2017  OT End of Session - 11/20/17 1454    Visit Number  8    Number of Visits  16    Date for OT Re-Evaluation  01/03/18 mini reassess on 6/26    Authorization - Visit Number  15    Authorization - Number of Visits  30    OT Start Time  1306    OT Stop Time  1346    OT Time Calculation (min)  40 min    Activity Tolerance  Patient tolerated treatment well    Behavior During Therapy  [facility]       Past Medical History:  Diagnosis Date  . Hyperlipidemia   . Hypertension   . Stroke [facility](HCC)     Past Surgical History:  Procedure Laterality Date  . TUBAL LIGATION      There were no vitals filed for this visit.  Subjective Assessment - 11/20/17 1430    Subjective   S: I slept good last night.     Currently in Pain?  No/denies                   OT Treatments/Exercises (OP) - 11/20/17 1430      Transfers   Transfers  Sit to Stand;Stand to Sit    Sit to Stand  4: Min guard    Stand to Sit  4: Min guard    Comments  OT blocking toe of LLE during transfer task and facilitating weightbearing for safety      Exercises   Exercises  Shoulder;Elbow;Wrist;Hand      Elbow Exercises   Elbow Extension  PROM;5 reps;AAROM;10 reps      Wrist Exercises   Wrist Flexion  PROM;5 reps    Wrist Extension  PROM;5 reps      Hand Exercises   Other Hand Exercises  digit composite flexion/extension, 5X, P/ROM      Neurological Re-education Exercises   Scapular Stabilization  Left;10 reps;Seated    Other Information  with mobilization to scapula provided during elevation and retraction, completed 10 reps     Shoulder Flexion  AAROM;10  reps;Limitations left hand secured with red theraband    Shoulder Protraction  AAROM;10 reps;Limitations left hand secured with red theraband    Other Exercises 1  upper trapezius stretch, levator stretch, 3X each, 10" holds    Weight Bearing Position  Seated    Seated with weight on forearm  position with arm at 90 degree angle at her side with weight on forearm and wrist, holding position for 30", 2 trials      Splinting   Splinting  Instructed pt and husband in donning of splint. Pt able to place hand and secure straps with OT holding arm and verbally cuing for correct strap placement               OT Short Term Goals - 11/05/17 1004      OT SHORT TERM GOAL #1   Title  Patient and family will be educated on a HEP for improved left arm mobility required for improved independence with daily tasks.     Time  4    Period  Weeks  Status  On-going      OT SHORT TERM GOAL #2   Title  Patient will improve left upper extremity p/rom to 75% range for improved comfort with ADL completion.     Time  4    Period  Weeks    Status  On-going      OT SHORT TERM GOAL #3   Title  Patient and family will be independent with donning and doffing and care of resting hand splint for left hand.     Time  4    Period  Weeks    Status  On-going      OT SHORT TERM GOAL #4   Title  Patient will be able to weightbear on left forearm while completing adls with min facilitation.     Time  4    Period  Weeks    Status  On-going      OT SHORT TERM GOAL #5   Title  Patient will use left arm as a gross assist during adl completion.     Time  4    Period  Weeks    Status  On-going        OT Long Term Goals - 11/05/17 1004      OT LONG TERM GOAL #1   Title  Patient will utilize left arm as an active assist with min facilitation during ADL completion.    Time  8    Period  Weeks    Status  On-going      OT LONG TERM GOAL #2   Title  Patient will improve left arm p/rom to [facility] in order for  improved comfort and active participation in adls.    Time  8    Period  Weeks    Status  On-going      OT LONG TERM GOAL #3   Title  Patient will be able to weightbear on her left arm with elbow and wrist extended with min facilitaiton.    Time  8    Period  Weeks    Status  On-going      OT LONG TERM GOAL #4   Title  Patient will be able to complete all B/ADLS with setup only.     Time  8    Period  Weeks    Status  On-going      OT LONG TERM GOAL #5   Title  Patient will use her left arm as an active assist with functional transfers.     Time  8    Period  Weeks    Status  On-going            Plan - 11/20/17 1454    Clinical Impression Statement  A: Discussed insurance limitations with pt and husband, who are agreeable to complete OT next week then continue with PT for remaining visits. Session focusing on improving independence with self-stretching for LUE, scapular mobilization, and AA/ROM exercises. Also reviewed donning splint with pt and husband. Pt reporting splint did not cause pain or soreness. Pt requiring verbal cuing for posture and form/technique during exercises, husband verbalized understanding for correct completion as well.     Plan  P: Follow up on splint wearing. For next two sessions focus on goals relating to improving independence in functional task completion utilizing compensatory strategies       Patient will benefit from skilled therapeutic intervention in order to improve the following deficits and impairments:  Impaired flexibility, Pain, Decreased  range of motion, Decreased strength, Impaired tone, Impaired UE functional use, Decreased safety awareness  Visit Diagnosis: Hemiplegia and hemiparesis following cerebral infarction affecting left dominant side (HCC)  Acute pain of left shoulder  Stiffness of left shoulder, not elsewhere classified  Other symptoms and signs involving the nervous system    Problem List Patient Active Problem  List   Diagnosis Date Noted  . CVA (cerebral vascular accident) (HCC) 07/29/2017  . Hypertension 08/19/2013  . HLD (hyperlipidemia) 08/19/2013   Ezra Sites, OTR/L  [phone] 11/20/2017, 2:58 PM  [facility] [address] [place], [place], [address] Phone: [phone]   Fax:  [phone]  Name: Tiffany King MRN: [id number] Date of Birth: [birth year]

## 2017-11-23 ENCOUNTER — Other Ambulatory Visit: Payer: Self-pay

## 2017-11-23 ENCOUNTER — Ambulatory Visit (HOSPITAL_COMMUNITY): Payer: BLUE CROSS/BLUE SHIELD

## 2017-11-23 ENCOUNTER — Encounter (HOSPITAL_COMMUNITY): Payer: Self-pay

## 2017-11-23 ENCOUNTER — Encounter (HOSPITAL_COMMUNITY): Payer: Self-pay | Admitting: Physical Therapy

## 2017-11-23 ENCOUNTER — Ambulatory Visit (HOSPITAL_COMMUNITY): Payer: BLUE CROSS/BLUE SHIELD | Admitting: Physical Therapy

## 2017-11-23 DIAGNOSIS — I69352 Hemiplegia and hemiparesis following cerebral infarction affecting left dominant side: Secondary | ICD-10-CM

## 2017-11-23 DIAGNOSIS — R262 Difficulty in walking, not elsewhere classified: Secondary | ICD-10-CM

## 2017-11-23 DIAGNOSIS — R29818 Other symptoms and signs involving the nervous system: Secondary | ICD-10-CM

## 2017-11-23 DIAGNOSIS — M25512 Pain in left shoulder: Secondary | ICD-10-CM

## 2017-11-23 DIAGNOSIS — M25612 Stiffness of left shoulder, not elsewhere classified: Secondary | ICD-10-CM

## 2017-11-23 DIAGNOSIS — M6281 Muscle weakness (generalized): Secondary | ICD-10-CM

## 2017-11-23 NOTE — Patient Instructions (Addendum)
Bridging    Slowly raise buttocks from floor, keeping stomach tight. Repeat __10__ times per set. Do ___1_ sets per session. Do 2____ sessions per day.  http://orth.exer.us/1096   Copyright  VHI. All rights reserved.  Strengthening: Hip Abduction (Side-Lying)    Tighten muscles on front of left thigh, then lift leg _10___ inches from surface, keeping knee locked.  Repeat __1__ times per set. Do __1__ sets per session. Do __2__ sessions per day.  http://orth.exer.us/622   Copyright  VHI. All rights reserved.  Functional Quadriceps: Chair Squat    Keeping feet flat on floor, shoulder width apart, squat as low as is comfortable. Use support as necessary. Repeat 10____ times per set. Do __1__ sets per session. Do _2___ sessions per day.  http://orth.exer.us/736   Copyright  VHI. All rights reserved.  Hip Abduction / Adduction: with Extended Knee (Supine)    Bring left leg out to side and return. Keep knee straight. Repeat __10__ times per set. Do ___1_ sets per session. Do __2__ sessions per day.  http://orth.exer.us/680   Copyright  VHI. All rights reserved.  Hip Abduction / Adduction: with Knee Flexion (Supine)    With left knee bent, gently lower knee to side and return. Repeat __10-__ times per set. Do __1__ sets per session. Do ____2 sessions per day.  http://orth.exer.us/682   Copyright  VHI. All rights reserved.  Hip Abduction / Adduction: with Extended Knee (Supine)    Bring left leg out to side and return. Keep knee straight.2 Repeat _10___ times per set. Do _1___ sets per session. Do _2___ sessions per day.  http://orth.exer.us/680   Copyright  VHI. All rights reserved.

## 2017-11-23 NOTE — Therapy (Signed)
[facility] [address] [place], [place], [address] Phone: [phone]   Fax:  [phone]  Occupational Therapy Treatment  Patient Details  Name: Tiffany King MRN: [id number] Date of Birth: [birth year] Referring Provider: Kirstie Peri, MD   Encounter Date: 11/23/2017  OT End of Session - 11/23/17 1344    Visit Number  9    Number of Visits  16    Date for OT Re-Evaluation  01/03/18    Authorization - Visit Number  16    Authorization - Number of Visits  30    OT Start Time  1347    OT Stop Time  1427    OT Time Calculation (min)  40 min    Activity Tolerance  Patient tolerated treatment well    Behavior During Therapy  [facility] for tasks assessed/performed       Past Medical History:  Diagnosis Date  . Hyperlipidemia   . Hypertension   . Stroke [facility])     Past Surgical History:  Procedure Laterality Date  . TUBAL LIGATION      There were no vitals filed for this visit.  Subjective Assessment - 11/23/17 1429    Subjective   S:  It is really stiff today and it has been hurting so I couldn't stretch it.    Currently in Pain?  No/denies         [facility] OT Assessment - 11/23/17 1343      Assessment   Medical Diagnosis  s/p R CVA with L hemiparesis      Precautions   Precautions  Fall               OT Treatments/Exercises (OP) - 11/23/17 1344      Exercises   Exercises  Shoulder;Elbow;Wrist;Hand      Shoulder Exercises: Stretch   Elbow Flexion  PROM;10 reps      Elbow Exercises   Elbow Extension  PROM;5 reps;AAROM;15 reps    Forearm Supination  PROM;10 reps    Forearm Pronation  PROM;10 reps      Wrist Exercises   Wrist Flexion  PROM;10 reps    Wrist Extension  PROM;10 reps      Hand Exercises   Other Hand Exercises  digit composite flexion/extension, 10X, P/ROM      Neurological Re-education Exercises   Shoulder Flexion  AAROM;10 reps;Limitations    Shoulder Protraction  AAROM;10  reps;Limitations    Other Exercises 1  upper trapezius stretch, levator stretch, 3X each, 10" holds    Weight Bearing Position  Seated    Seated with weight on forearm  position with arm at 90 degree angle at her side with weight on forearm and wrist, holding position for 30", 2 trials      Splinting   Splinting  Instructed patient on wearing schedule including being able to wear at night as long as it is comfortable and does not irritate the skin. Helped patient don splint             OT Education - 11/23/17 1416    Education provided  Yes    Education Details  Patient educated on compensatory strategies for upper body dressing and encouraged to do stretches to prevent stiffness, cramps, and pain at night.    Person(s) Educated  Patient;Spouse    Methods  Explanation;Demonstration    Comprehension  Verbalized understanding       OT Short Term Goals - 11/05/17 1004  OT SHORT TERM GOAL #1   Title  Patient and family will be educated on a HEP for improved left arm mobility required for improved independence with daily tasks.     Time  4    Period  Weeks    Status  On-going      OT SHORT TERM GOAL #2   Title  Patient will improve left upper extremity p/rom to 75% range for improved comfort with ADL completion.     Time  4    Period  Weeks    Status  On-going      OT SHORT TERM GOAL #3   Title  Patient and family will be independent with donning and doffing and care of resting hand splint for left hand.     Time  4    Period  Weeks    Status  On-going      OT SHORT TERM GOAL #4   Title  Patient will be able to weightbear on left forearm while completing adls with min facilitation.     Time  4    Period  Weeks    Status  On-going      OT SHORT TERM GOAL #5   Title  Patient will use left arm as a gross assist during adl completion.     Time  4    Period  Weeks    Status  On-going        OT Long Term Goals - 11/05/17 1004      OT LONG TERM GOAL #1   Title   Patient will utilize left arm as an active assist with min facilitation during ADL completion.    Time  8    Period  Weeks    Status  On-going      OT LONG TERM GOAL #2   Title  Patient [facility] for improved comfort and active participation in adls.    Time  8    Period  Weeks    Status  On-going      OT LONG TERM GOAL #3   Title  Patient will be able to weightbear on her left arm with elbow and wrist extended with min facilitaiton.    Time  8    Period  Weeks    Status  On-going      OT LONG TERM GOAL #4   Title  Patient will be able to complete all B/ADLS with setup only.     Time  8    Period  Weeks    Status  On-going      OT LONG TERM GOAL #5   Title  Patient will use her left arm as an active assist with functional transfers.     Time  8    Period  Weeks    Status  On-going            Plan - 11/23/17 1430    Clinical Impression Statement  A: Consulted with patient on remaining deficits in order to identify key areas of concern to focus on during last two sessions. 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 Patient reports she still requires help for upper body dressing. Patient educated on compensatory dressing strategies such as wearing loose clothing and taking uneffected arm out first. Remainder of session focused on weight bearing on left elbow,  P/ROM of left shoulder, elbow, wrist, and digits, and AA/ROM to increase range of affected extremity. Patient maintains high levels of pain with stretching/movement. Patient complained of frequent muscle cramps causing sleep disturbance. Therapist recommended patient complete stretches to the left upper and lower extremity several times a day. Therapist helped patient don hand brace due to increased stiffness and pain in the hand this session. Moderate VC required during exercises for form and technique.     Plan  P: Take measurements and discharge from OT. Offer compensatory strategies for functional tasks that the patient still maintains difficulty with.    Consulted and Agree with Plan of Care  Patient;Family member/caregiver    Family Member Consulted  husband, daughter       Patient will benefit from skilled therapeutic intervention in order to improve the following deficits and impairments:  Impaired flexibility, Pain, Decreased range of motion, Decreased strength, Impaired tone, Impaired UE functional use, Decreased safety awareness  Visit Diagnosis: Hemiplegia and hemiparesis following cerebral infarction affecting left dominant side (HCC)  Acute pain of left shoulder  Stiffness of left shoulder, not elsewhere classified  Other symptoms and signs involving the nervous system  Muscle weakness (generalized)    Problem List Patient Active Problem List   Diagnosis Date Noted  . CVA (cerebral vascular accident) (HCC) 07/29/2017  . Hypertension 08/19/2013  . HLD (hyperlipidemia) 08/19/2013    Holley Raring, OT student 11/23/2017, 5:24 PM  [facility] [address] [place], [place], [address] Phone: [phone]   Fax:  [phone]  Name: ILYNN STAUFFER MRN: [id number] Date of Birth: 03/10/1956

## 2017-11-23 NOTE — Therapy (Signed)
[facility] [address] [place], [place], [address] Phone: [phone]   Fax:  [phone]  Physical Therapy Treatment  Patient Details  Name: BERNIE FOBES MRN: [id number] Date of Birth: 22-Sep-1955 Referring Provider: Kirstie Peri, MD   Encounter Date: 11/23/2017  PT End of Session - 11/23/17 1529    Visit Number  2    Number of Visits  16    Date for PT Re-Evaluation  01/13/18    Authorization Type  [employer] Other    Authorization Time Period  11/18/17 to 01/13/18    Authorization - Visit Number  17 7 visits used for HHPT, 7 OPOT appointments, + 2PT    Authorization - Number of Visits  30    PT Start Time  1430    PT Stop Time  1520    PT Time Calculation (min)  50 min    Equipment Utilized During Treatment  Gait belt    Activity Tolerance  Patient tolerated treatment well;No increased pain    Behavior During Therapy  WFL for tasks assessed/performed       Past Medical History:  Diagnosis Date  . Hyperlipidemia   . Hypertension   . Stroke [facility])     Past Surgical History:  Procedure Laterality Date  . TUBAL LIGATION      There were no vitals filed for this visit.  Subjective Assessment - 11/23/17 1414    Subjective  PT states that she has been trying to do the exercises at home.      Limitations  Walking;Standing;House hold activities    How long can you sit comfortably?  no issues    How long can you stand comfortably?  "a long time"    How long can you walk comfortably?  100 some feet    Patient Stated Goals  to walk and stand in a classroom    Currently in Pain?  Yes    Pain Score  6     Pain Location  Hip    Pain Orientation  Left;Anterior    Pain Descriptors / Indicators  Discomfort    Pain Type  Acute pain    Pain Onset  1 to 4 weeks ago    Pain Frequency  Constant    Aggravating Factors   not sure    Pain Relieving Factors  sitting up     Effect of Pain on Daily Activities  limits                        [facility] Adult PT Treatment/Exercise - 11/23/17 1459      Ambulation/Gait   Ambulation Distance (Feet)  150 Feet    Assistive device  Hemi-walker    Gait Pattern  Step-to pattern;Decreased hip/knee flexion - left;Decreased dorsiflexion - left;Left circumduction;Poor foot clearance - left;Abducted - left      Exercises   Exercises  Knee/Hip      Knee/Hip Exercises: Standing   Heel Raises  Both;10 reps    Functional Squat  10 reps    Other Standing Knee Exercises  side stepping at mat x 2 RT       Knee/Hip Exercises: Seated   Sit to Sand  10 reps making sure pt puts weigth through her LT LT       Knee/Hip Exercises: Supine   Bridges  Both;10 reps    Other Supine Knee/Hip Exercises  hip abduction 10    Other Supine Knee/Hip  Exercises  clam x 10 ; PROM to ankle       Modalities   Modalities  Geologist, engineering Location  LT  AT    Electrical Stimulation Action  mm contraction     Electrical Stimulation Parameters  Russian stim; ; 10/10  x 10 minutes     Electrical Stimulation Goals  Strength             PT Education - 11/23/17 1529    Education Details  Added HEP    Person(s) Educated  Patient    Methods  Explanation;Handout    Comprehension  Verbalized understanding;Returned demonstration       PT Short Term Goals - 11/23/17 1537      PT SHORT TERM GOAL #1   Title  Pt and caregiver will be independent with pt's HEP and perform consistently in order to improve strength and flexibility.    Time  4    Period  Weeks    Status  On-going      PT SHORT TERM GOAL #2   Title  Pt will have 1/2 grade improvement in MMT thorughout BLE in order to maximize transfers, gait, and balance.    Time  4    Period  Weeks    Status  On-going      PT SHORT TERM GOAL #3   Title  Pt will have 61ft improvement in with LRAD and she will demonstrate L foot drop 50% of the time or < in  order to demo improved gait, balance, and maximize her household and community ambluation.    Time  4    Period  Weeks    Status  On-going      PT SHORT TERM GOAL #4   Title  Pt will be able to perform 5xSTS in 35sec or < with improved mechanics in order to demo improved functional strength and improved balance.    Time  4    Period  Weeks    Status  On-going      PT SHORT TERM GOAL #5   Title  Pt will have improved TUG time by 5 seconds with LRAD in order to demo improved balance and maximze household ambulation.    Time  4    Period  Weeks    Status  On-going        PT Long Term Goals - 11/23/17 1537      PT LONG TERM GOAL #1   Title  Pt will have 1 grade improvement in MMT throughout BLE in order to further maximize gait, balance, and functional mobility.    Time  8    Period  Weeks    Status  On-going      PT LONG TERM GOAL #2   Title  Pt will 145ft improvement in with LRAD in order to further maximize household and community ambulation.    Time  8    Period  Weeks    Status  On-going      PT LONG TERM GOAL #3   Title  Pt will be able to perofrm 5xSTS in 30 sec or < with mechanics WFL in order to further demo improved functional strength and balance and decrease her risk for falls.    Time  8    Period  Weeks    Status  On-going      PT LONG TERM GOAL #4  Title  Pt will have improved TUG time by 10 sec or > with LRAD in order to further demo improved balance and functional mobility.    Time  8    Period  Weeks    Status  On-going      PT LONG TERM GOAL #5   Title  Pt will be supervision to modified independent with car transfers in order to maximzie pt's independence with functional mobility.    Time  8    Period  Weeks    Status  On-going            Plan - 11/23/17 1531    Clinical Impression Statement  Evaluation and goals were reviewed with patient and family.  Gave pt's husband order for AFO with area vendors they could call to set up an  appointment.  Pt brought her own hemiwalker in which was adjusted by PT.  Added heel raises/mini squats, side steps, bridging, clams and supine hip abduction with verbal/manual cuing to prevent substitutional patterns.  Attempted Russian stim to ankle without successful contraction.     Rehab Potential  Fair    PT Frequency  2x / week    PT Duration  8 weeks    PT Treatment/Interventions  ADLs/Self Care Home Management;Aquatic Therapy;Biofeedback;Cryotherapy;Electrical Stimulation;Moist Heat;Ultrasound;DME Instruction;Gait training;Stair training;Functional mobility training;Therapeutic activities;Therapeutic exercise;Balance training;Neuromuscular re-education;Patient/family education;Orthotic Fit/Training;Wheelchair mobility training;Manual techniques;Scar mobilization;Passive range of motion;Dry needling;Energy conservation;Splinting;Taping    PT Next Visit Plan  Perform TUG, continue with [place] stimulation attempting to stimulate anterior tibialis.  See if family has made pt an appointment for orthotic.  Continue to work on closed chain exercises to assist in decreasing tone making sure pt is weightbearing on her left side.      PT Home Exercise Plan  eval: lying supine to stretch hip flexors, assisted calf stretch from caregiver    Consulted and Agree with Plan of Care  Patient;Family member/caregiver    Family Member Consulted  husband       Patient will benefit from skilled therapeutic intervention in order to improve the following deficits and impairments:  Abnormal gait, Decreased activity tolerance, Decreased balance, Decreased coordination, Decreased endurance, Decreased mobility, Decreased range of motion, Decreased strength, Difficulty walking, Hypomobility, Increased fascial restricitons, Impaired flexibility, Impaired tone, Impaired UE functional use, Pain  Visit Diagnosis: Hemiplegia and hemiparesis following cerebral infarction affecting left dominant side (HCC)  Muscle weakness  (generalized)  Difficulty in walking, not elsewhere classified     Problem List Patient Active Problem List   Diagnosis Date Noted  . CVA (cerebral vascular accident) (HCC) 07/29/2017  . Hypertension 08/19/2013  . HLD (hyperlipidemia) 08/19/2013    Virgina Organynthia Yehonatan Grandison, PT CLT [phone] 11/23/2017, 3:38 PM  [facility] [address] [place], [place], [address] Phone: [phone]   Fax:  [phone]  Name: Leatrice JewelsVickie M Mccaslin MRN: [id number] Date of Birth: 02/22/1956

## 2017-11-25 ENCOUNTER — Other Ambulatory Visit: Payer: Self-pay

## 2017-11-25 ENCOUNTER — Ambulatory Visit (HOSPITAL_COMMUNITY): Payer: BLUE CROSS/BLUE SHIELD

## 2017-11-25 ENCOUNTER — Telehealth (HOSPITAL_COMMUNITY): Payer: Self-pay | Admitting: Physical Therapy

## 2017-11-25 ENCOUNTER — Ambulatory Visit (HOSPITAL_COMMUNITY): Payer: BLUE CROSS/BLUE SHIELD | Admitting: Physical Therapy

## 2017-11-25 ENCOUNTER — Encounter (HOSPITAL_COMMUNITY): Payer: Self-pay

## 2017-11-25 DIAGNOSIS — I69352 Hemiplegia and hemiparesis following cerebral infarction affecting left dominant side: Secondary | ICD-10-CM | POA: Diagnosis not present

## 2017-11-25 DIAGNOSIS — M25512 Pain in left shoulder: Secondary | ICD-10-CM

## 2017-11-25 DIAGNOSIS — R29818 Other symptoms and signs involving the nervous system: Secondary | ICD-10-CM

## 2017-11-25 DIAGNOSIS — M25612 Stiffness of left shoulder, not elsewhere classified: Secondary | ICD-10-CM

## 2017-11-25 NOTE — Telephone Encounter (Signed)
PT called RE missed appointment. Therapist reminded pt of next OT and PT appointments on phone message.  Virgina Organynthia Vivion Romano, PT CLT [phone]

## 2017-11-25 NOTE — Therapy (Addendum)
[place], [place], [address] Phone: [phone]   Fax:  [phone]  Occupational Therapy Treatment  Patient Details  Name: Tiffany King MRN: [id number] Date of Birth: 1955-08-23 Referring Provider: Monico Blitz, MD   Encounter Date: 11/25/2017  OT End of Session - 11/25/17 1214    Visit Number  10    Number of Visits  16    Authorization Type  [employer] - 30 visits PT and OT combined, has used 7, approved codes:  eval, therapeutic exercises, therapeutic activities, manual therapy, self care     Authorization - Visit Number  17    Authorization - Number of Visits  30    OT Start Time  1120    OT Stop Time  1200    OT Time Calculation (min)  40 min    Activity Tolerance  Patient tolerated treatment well    Behavior During Therapy  [facility] for tasks assessed/performed       Past Medical History:  Diagnosis Date  . Hyperlipidemia   . Hypertension   . Stroke [facility])     Past Surgical History:  Procedure Laterality Date  . TUBAL LIGATION      There were no vitals filed for this visit.  Subjective Assessment - 11/25/17 1208    Subjective   S:  I've really been trying to stretch it out so it is feeling a lot more loose today.    Currently in Pain?  No/denies         [facility] OT Assessment - 11/25/17 1114      Assessment   Medical Diagnosis  s/p R CVA with L hemiparesis      Precautions   Precautions  Fall      Observation/Other Assessments   Focus on Therapeutic Outcomes (FOTO)   12/100      ROM / Strength   AROM / PROM / Strength  PROM      PROM   Overall PROM Comments  assessed in seated; IR/er adducted    PROM Assessment Site  Shoulder;Elbow;Wrist    Right/Left Shoulder  Left    Left Shoulder Flexion  80 Degrees previous: 20    Left Shoulder ABduction  72 Degrees previous: 20    Left Shoulder Internal Rotation  90 Degrees previous: 70    Left Shoulder External Rotation  20 Degrees  previous: 0    Right/Left Elbow  Left    Left Elbow Flexion  140 previous: 140    Left Elbow Extension  -- lacks 30 degrees from neutral (Previous: lacks 50 degrees from neutral)   Right/Left Forearm  --    Right/Left Wrist  Left    Left Wrist Extension  28 Degrees previous: 0    Left Wrist Flexion  90 Degrees previous: 30    Left Composite Finger Extension  -- 95% (previous at 75%)    Left Composite Finger Flexion  -- 85% (Previous at 75%)               OT Treatments/Exercises (OP) - 11/25/17 1209      Exercises   Exercises  Shoulder;Elbow;Wrist;Hand;Theraputty      Shoulder Exercises: Seated   External Rotation  PROM;5 reps    Internal Rotation  PROM;5 reps    Flexion  PROM;5 reps    Abduction  PROM;5 reps      Elbow Exercises   Other elbow exercises  Patient worked on weightbearing on left elbow  and forearm while putting pegs in and taking them out with right hand. Pegboard positioned on low surface to the left side of patient to encourage cross body reaching. Patient completed weight bearing 2X for approximately 2' each time             OT Education - 11/25/17 1212    Education provided  Yes    Education Details  Patient reviewed progress towards goals with therapist. Patient educated on exercises she can do at home following discharge. Patient given yellow theraputty to work on flattening and squeezing at home.    Person(s) Educated  Patient;Spouse;Child(ren)    Methods  Explanation;Demonstration;Verbal cues    Comprehension  Verbalized understanding;Returned demonstration       OT Short Term Goals - 11/26/17 0825      OT SHORT TERM GOAL #1   Title  Patient and family will be educated on a HEP for improved left arm mobility required for improved independence with daily tasks.     Time  4    Period  Weeks    Status  Achieved      OT SHORT TERM GOAL #2   Title  Patient will improve left upper extremity p/rom to 75% range for improved comfort with ADL  completion.     Time  4    Period  Weeks    Status  Not Met      OT SHORT TERM GOAL #3   Title  Patient and family will be independent with donning and doffing and care of resting hand splint for left hand.     Time  4    Period  Weeks    Status  Achieved      OT SHORT TERM GOAL #4   Title  Patient will be able to weightbear on left forearm while completing adls with min facilitation.     Time  4    Period  Weeks    Status  Achieved      OT SHORT TERM GOAL #5   Title  Patient will use left arm as a gross assist during adl completion.     Time  4    Period  Weeks    Status  Achieved        OT Long Term Goals - 11/26/17 0825      OT LONG TERM GOAL #1   Title  Patient will utilize left arm as an active assist with min facilitation during ADL completion.    Time  8    Period  Weeks    Status  Not Met      OT LONG TERM GOAL #2   Title  Patient will improve left arm p/rom to [facility] in order for improved comfort and active participation in adls.    Time  8    Period  Weeks    Status  Not Met      OT LONG TERM GOAL #3   Title  Patient will be able to weightbear on her left arm with elbow and wrist extended with min facilitaiton.    Time  8    Period  Weeks    Status  Not Met      OT LONG TERM GOAL #4   Title  Patient will be able to complete all B/ADLS with setup only.     Time  8    Period  Weeks    Status  Not Met      OT LONG  TERM GOAL #5   Title  Patient will use her left arm as an active assist with functional transfers.     Time  8    Period  Weeks    Status  Not Met            Plan - 11/25/17 1217    Clinical Impression Statement  A: Measurements taken today prior to discharge. Patient to discharge from OT due to insurance limitations and patient/family deciding they want to use remaining visits for PT. Patient has improved in P/ROM for the shoulder, elbow, wrist, and digits. Deficits remain in P/ROM, A/ROM, strength, and coordination of LUE. Therapist  reviewed goals with patient and determined that patient has met all short term goals except for P/ROM. Long term goals not met at this time due to ending skilled OT services as a result of insurance limitations. Patient and family educated on exercises and stretches to do following discharge and patient was given yellow theraputty to work with per patient request for hand ROM and strengthening. Patient completed weight bearing exercise this session with distraction and participated well with treatment.   Plan  P: Patient to discharge from skilled OT services and continue with HEP independently.     Consulted and Agree with Plan of Care  Patient;Family member/caregiver    Family Member Consulted  husband, daughter       Patient will benefit from skilled therapeutic intervention in order to improve the following deficits and impairments:  Impaired flexibility, Pain, Decreased range of motion, Decreased strength, Impaired tone, Impaired UE functional use, Decreased safety awareness  Visit Diagnosis: Hemiplegia and hemiparesis following cerebral infarction affecting left dominant side (HCC)  Acute pain of left shoulder  Stiffness of left shoulder, not elsewhere classified  Other symptoms and signs involving the nervous system    Problem List Patient Active Problem List   Diagnosis Date Noted  . CVA (cerebral vascular accident) ([place]) 07/29/2017  . Hypertension 08/19/2013  . HLD (hyperlipidemia) 08/19/2013    Roderic Palau, OT student 11/26/2017, 8:26 AM  [place], [place], [address] Phone: [phone]   Fax:  [phone]  Name: Tiffany King MRN: [id number] Date of Birth: 1956/04/27   OCCUPATIONAL THERAPY DISCHARGE SUMMARY  Visits from Start of Care: 10  Current functional level related to goals / functional outcomes: See above   Remaining deficits: See above   Education / Equipment: See  above Plan: Patient agrees to discharge.  Patient goals were partially met. Patient is being discharged due to the patient's request.  ?????         Ailene Ravel, OTR/L,CBIS  [phone]

## 2017-11-27 ENCOUNTER — Encounter (HOSPITAL_COMMUNITY): Payer: BLUE CROSS/BLUE SHIELD | Admitting: Occupational Therapy

## 2017-11-30 ENCOUNTER — Encounter (HOSPITAL_COMMUNITY): Payer: BLUE CROSS/BLUE SHIELD | Admitting: Specialist

## 2017-12-02 ENCOUNTER — Ambulatory Visit (HOSPITAL_COMMUNITY): Payer: BLUE CROSS/BLUE SHIELD | Admitting: Physical Therapy

## 2017-12-02 ENCOUNTER — Encounter (HOSPITAL_COMMUNITY): Payer: Self-pay | Admitting: Physical Therapy

## 2017-12-02 ENCOUNTER — Encounter (HOSPITAL_COMMUNITY): Payer: BLUE CROSS/BLUE SHIELD | Admitting: Occupational Therapy

## 2017-12-02 DIAGNOSIS — R262 Difficulty in walking, not elsewhere classified: Secondary | ICD-10-CM

## 2017-12-02 DIAGNOSIS — I69352 Hemiplegia and hemiparesis following cerebral infarction affecting left dominant side: Secondary | ICD-10-CM | POA: Diagnosis not present

## 2017-12-02 DIAGNOSIS — M6281 Muscle weakness (generalized): Secondary | ICD-10-CM

## 2017-12-02 NOTE — Therapy (Signed)
[place] [facility] [address] [place], [place], [address] Phone: [phone]   Fax:  [phone]  Physical Therapy Treatment  Patient Details  Name: Tiffany King MRN: [id number] Date of Birth: [birth year] Referring Provider: Kirstie Peri, MD   Encounter Date: 12/02/2017  PT End of Session - 12/02/17 1449    Visit Number  3    Number of Visits  12    Date for PT Re-Evaluation  01/13/18    Authorization Type  [employer] Other    Authorization Time Period  11/18/17 to 01/13/18    Authorization - Visit Number  21 7 visits used for HHPT, 10 OPOT appointments, + 3PT    Authorization - Number of Visits  30    PT Start Time  1350    PT Stop Time  1437    PT Time Calculation (min)  47 min    Equipment Utilized During Treatment  Gait belt    Activity Tolerance  Patient tolerated treatment well;No increased pain    Behavior During Therapy  WFL for tasks assessed/performed       Past Medical History:  Diagnosis Date  . Hyperlipidemia   . Hypertension   . Stroke [facility])     Past Surgical History:  Procedure Laterality Date  . TUBAL LIGATION      There were no vitals filed for this visit.  Subjective Assessment - 12/02/17 1352    Subjective  PT states that she has been trying to do the exercises at home.      Limitations  Walking;Standing;House hold activities    How long can you sit comfortably?  no issues    How long can you stand comfortably?  "a long time"    How long can you walk comfortably?  100 some feet    Patient Stated Goals  to walk and stand in a classroom    Pain Score  8     Pain Location  Knee    Pain Orientation  Right    Pain Descriptors / Indicators  Aching;Sore    Pain Type  Acute pain    Pain Onset  1 to 4 weeks ago    Aggravating Factors   activity     Pain Relieving Factors  pain patches    Effect of Pain on Daily Activities  limits                        [facility] Adult PT Treatment/Exercise - 12/02/17  0001      Ambulation/Gait   Ambulation Distance (Feet)  226 Feet    Assistive device  Hemi-walker    Gait Pattern  Step-to pattern;Decreased hip/knee flexion - left;Decreased dorsiflexion - left;Left circumduction;Poor foot clearance - left;Abducted - left      Knee/Hip Exercises: Standing   Other Standing Knee Exercises  side stepping at mat x 2 RT       Knee/Hip Exercises: Seated   Sit to Sand  10 reps making sure pt puts weigth through her LT LT       Knee/Hip Exercises: Prone   Other Prone Exercises  attempted tall kneeling but pt could not tolerate.                PT Short Term Goals - 11/23/17 1537      PT SHORT TERM GOAL #1   Title  Pt and caregiver will be independent with pt's HEP and perform consistently in order to  improve strength and flexibility.    Time  4    Period  Weeks    Status  On-going      PT SHORT TERM GOAL #2   Title  Pt will have 1/2 grade improvement in MMT thorughout BLE in order to maximize transfers, gait, and balance.    Time  4    Period  Weeks    Status  On-going      PT SHORT TERM GOAL #3   Title  Pt will have 6750ft improvement in 3MWT with LRAD and she will demonstrate L foot drop 50% of the time or < in order to demo improved gait, balance, and maximize her household and community ambluation.    Time  4    Period  Weeks    Status  On-going      PT SHORT TERM GOAL #4   Title  Pt will be able to perform 5xSTS in 35sec or < with improved mechanics in order to demo improved functional strength and improved balance.    Time  4    Period  Weeks    Status  On-going      PT SHORT TERM GOAL #5   Title  Pt will have improved TUG time by 5 seconds with LRAD in order to demo improved balance and maximze household ambulation.    Time  4    Period  Weeks    Status  On-going        PT Long Term Goals - 11/23/17 1537      PT LONG TERM GOAL #1   Title  Pt will have 1 grade improvement in MMT throughout BLE in order to further maximize  gait, balance, and functional mobility.    Time  8    Period  Weeks    Status  On-going      PT LONG TERM GOAL #2   Title  Pt will 1500ft improvement in 3MWT with LRAD in order to further maximize household and community ambulation.    Time  8    Period  Weeks    Status  On-going      PT LONG TERM GOAL #3   Title  Pt will be able to perofrm 5xSTS in 30 sec or < with mechanics WFL in order to further demo improved functional strength and balance and decrease her risk for falls.    Time  8    Period  Weeks    Status  On-going      PT LONG TERM GOAL #4   Title  Pt will have improved TUG time by 10 sec or > with LRAD in order to further demo improved balance and functional mobility.    Time  8    Period  Weeks    Status  On-going      PT LONG TERM GOAL #5   Title  Pt will be supervision to modified independent with car transfers in order to maximzie pt's independence with functional mobility.    Time  8    Period  Weeks    Status  On-going            Plan - 12/02/17 1450    Clinical Impression Statement  Pt has increased flexor tone in both UE and LE.  Therapist urged pt to contact MD to see if she would be a candidate for botox. Pt vocalized fear of needles.  Pt has not made an appointment with the orthotist at this  time.  Therapist urged family to do so as there is significant ankle instablity.  Attempted tall kneeling with pt but pt unable to tolerate this position due to increased knee pain.  Pt needed moderate verbal and tactile cuing to keep pelvis in good alignment with ambulation.     Rehab Potential  Fair    PT Frequency  2x / week    PT Duration  8 weeks    PT Treatment/Interventions  ADLs/Self Care Home Management;Aquatic Therapy;Biofeedback;Cryotherapy;Electrical Stimulation;Moist Heat;Ultrasound;DME Instruction;Gait training;Stair training;Functional mobility training;Therapeutic activities;Therapeutic exercise;Balance training;Neuromuscular  re-education;Patient/family education;Orthotic Fit/Training;Wheelchair mobility training;Manual techniques;Scar mobilization;Passive range of motion;Dry needling;Energy conservation;Splinting;Taping    PT Next Visit Plan  Attempt tall kneeling position again.  Work on [employer] shifting with patient.      PT Home Exercise Plan  eval: lying supine to stretch hip flexors, assisted calf stretch from caregiver    Consulted and Agree with Plan of Care  Patient;Family member/caregiver    Family Member Consulted  husband       Patient will benefit from skilled therapeutic intervention in order to improve the following deficits and impairments:  Abnormal gait, Decreased activity tolerance, Decreased balance, Decreased coordination, Decreased endurance, Decreased mobility, Decreased range of motion, Decreased strength, Difficulty walking, Hypomobility, Increased fascial restricitons, Impaired flexibility, Impaired tone, Impaired UE functional use, Pain  Visit Diagnosis: Hemiplegia and hemiparesis following cerebral infarction affecting left dominant side (HCC)  Muscle weakness (generalized)  Difficulty in walking, not elsewhere classified     Problem List Patient Active Problem List   Diagnosis Date Noted  . CVA (cerebral vascular accident) (HCC) 07/29/2017  . Hypertension 08/19/2013  . HLD (hyperlipidemia) 08/19/2013    Virgina Organ, PT CLT [phone] 12/02/2017, 2:56 PM  [place] [facility] [address] [place], [place], [address] Phone: [phone]   Fax:  [phone]  Name: Tiffany King MRN: [id number] Date of Birth: [birth year]

## 2017-12-04 ENCOUNTER — Encounter (HOSPITAL_COMMUNITY): Payer: BLUE CROSS/BLUE SHIELD | Admitting: Occupational Therapy

## 2017-12-07 ENCOUNTER — Ambulatory Visit (HOSPITAL_COMMUNITY): Payer: BLUE CROSS/BLUE SHIELD | Attending: Orthopaedic Surgery

## 2017-12-07 DIAGNOSIS — M6281 Muscle weakness (generalized): Secondary | ICD-10-CM | POA: Diagnosis present

## 2017-12-07 DIAGNOSIS — I69352 Hemiplegia and hemiparesis following cerebral infarction affecting left dominant side: Secondary | ICD-10-CM | POA: Diagnosis not present

## 2017-12-07 DIAGNOSIS — R29818 Other symptoms and signs involving the nervous system: Secondary | ICD-10-CM | POA: Insufficient documentation

## 2017-12-07 DIAGNOSIS — R262 Difficulty in walking, not elsewhere classified: Secondary | ICD-10-CM | POA: Insufficient documentation

## 2017-12-07 NOTE — Therapy (Signed)
[place] [facility] [address] [place], [place], [address] Phone: [phone]   Fax:  [phone]  Physical Therapy Treatment  Patient Details  Name: Tiffany King MRN: [id number] Date of Birth: 04/16/1956 Referring Provider: Kirstie PeriAshish Shah, MD   Encounter Date: 12/07/2017  PT End of Session - 12/07/17 1023    Visit Number  4    Number of Visits  12    Date for PT Re-Evaluation  01/13/18    Authorization Type  [employer] Other    Authorization Time Period  11/18/17 to 01/13/18    Authorization - Visit Number  22 7 visits used for HHPT, 10 OPOT appointments, + 3PT    Authorization - Number of Visits  30    PT Start Time  1023    PT Stop Time  1110    PT Time Calculation (min)  47 min    Equipment Utilized During Treatment  Gait belt    Activity Tolerance  Patient tolerated treatment well;No increased pain    Behavior During Therapy  WFL for tasks assessed/performed       Past Medical History:  Diagnosis Date  . Hyperlipidemia   . Hypertension   . Stroke [facility](HCC)     Past Surgical History:  Procedure Laterality Date  . TUBAL LIGATION      There were no vitals filed for this visit.  Subjective Assessment - 12/07/17 1023    Subjective  Pt's husband reports that they are going to call this week about getting her scheduled with an orthotist. She reports some pain near her groin about 5/10; mainly hurts when she moves it.    Limitations  Walking;Standing;House hold activities    How long can you sit comfortably?  no issues    How long can you stand comfortably?  "a long time"    How long can you walk comfortably?  100 some feet    Patient Stated Goals  to walk and stand in a classroom    Currently in Pain?  Yes    Pain Score  5     Pain Location  Hip    Pain Orientation  Left    Pain Descriptors / Indicators  Sore    Pain Type  Chronic pain    Pain Onset  1 to 4 weeks ago    Pain Frequency  Intermittent    Aggravating Factors   activity     Pain Relieving Factors  sitting    Effect of Pain on Daily Activities  limits           [facility] Adult PT Treatment/Exercise - 12/07/17 0001      Ambulation/Gait   Ambulation Distance (Feet)  130 Feet    Assistive device  Hemi-walker    Gait Pattern  Step-to pattern;Decreased hip/knee flexion - left;Decreased dorsiflexion - left;Left circumduction;Poor foot clearance - left;Abducted - left      Knee/Hip Exercises: Stretches   Other Knee/Hip Stretches  manual HS and quad stretching in sitting 5x15" holds each for decreasing tone      Knee/Hip Exercises: Standing   Other Standing Knee Exercises  side stepping at mat x 2 RT (one to no UE assist)    Other Standing Knee Exercises  weight shifting in standing x5 mins (PT assisted with L knee extnesion)      Knee/Hip Exercises: Seated   Sit to Sand  10 reps;with UE support 2" step under RLE to facilitate increased weight shift LLE  Knee/Hip Exercises: Prone   Other Prone Exercises  attempted tall kneeling but pt could not tolerate.             PT Education - 12/07/17 1023    Education Details  continue HEP; gait wtih hemiwalker, benefits of tall kneeling    Person(s) Educated  Patient    Methods  Explanation;Demonstration    Comprehension  Verbalized understanding;Returned demonstration       PT Short Term Goals - 11/23/17 1537      PT SHORT TERM GOAL #1   Title  Pt and caregiver will be independent with pt's HEP and perform consistently in order to improve strength and flexibility.    Time  4    Period  Weeks    Status  On-going      PT SHORT TERM GOAL #2   Title  Pt will have 1/2 grade improvement in MMT thorughout BLE in order to maximize transfers, gait, and balance.    Time  4    Period  Weeks    Status  On-going      PT SHORT TERM GOAL #3   Title  Pt will have 74ft improvement in with LRAD and she will demonstrate L foot drop 50% of the time or < in order to demo improved gait, balance, and maximize  her household and community ambluation.    Time  4    Period  Weeks    Status  On-going      PT SHORT TERM GOAL #4   Title  Pt will be able to perform 5xSTS in 35sec or < with improved mechanics in order to demo improved functional strength and improved balance.    Time  4    Period  Weeks    Status  On-going      PT SHORT TERM GOAL #5   Title  Pt will have improved TUG time by 5 seconds with LRAD in order to demo improved balance and maximze household ambulation.    Time  4    Period  Weeks    Status  On-going        PT Long Term Goals - 11/23/17 1537      PT LONG TERM GOAL #1   Title  Pt will have 1 grade improvement in MMT throughout BLE in order to further maximize gait, balance, and functional mobility.    Time  8    Period  Weeks    Status  On-going      PT LONG TERM GOAL #2   Title  Pt will 122ft improvement in with LRAD in order to further maximize household and community ambulation.    Time  8    Period  Weeks    Status  On-going      PT LONG TERM GOAL #3   Title  Pt will be able to perofrm 5xSTS in 30 sec or < with mechanics WFL in order to further demo improved functional strength and balance and decrease her risk for falls.    Time  8    Period  Weeks    Status  On-going      PT LONG TERM GOAL #4   Title  Pt will have improved TUG time by 10 sec or > with LRAD in order to further demo improved balance and functional mobility.    Time  8    Period  Weeks    Status  On-going  PT LONG TERM GOAL #5   Title  Pt will be supervision to modified independent with car transfers in order to maximzie pt's independence with functional mobility.    Time  8    Period  Weeks    Status  On-going            Plan - 12/07/17 1115    Clinical Impression Statement  Continued with established POC focusing on addressing tone, functional strength, and functional mobility. Still unable to tolerate tall kneeling due to pain; will continue to attempt this as it is  beneficial for tone. Added manual quad and HS stretching in sitting to address tone and educated pt and her husband to perform at home. They are planning to call an orthotist this week regarding her AFO. Continue as planned, progressing as able.    Rehab Potential  Fair    PT Frequency  2x / week    PT Duration  8 weeks    PT Treatment/Interventions  ADLs/Self Care Home Management;Aquatic Therapy;Biofeedback;Cryotherapy;Electrical Stimulation;Moist Heat;Ultrasound;DME Instruction;Gait training;Stair training;Functional mobility training;Therapeutic activities;Therapeutic exercise;Balance training;Neuromuscular re-education;Patient/family education;Orthotic Fit/Training;Wheelchair mobility training;Manual techniques;Scar mobilization;Passive range of motion;Dry needling;Energy conservation;Splinting;Taping    PT Next Visit Plan  Continue to attempt tall kneeling position for tone.  Work on [employer] shifting with patient.  potentially trial BWSS to work on standing balance/address tone in WB safely     PT Home Exercise Plan  eval: lying supine to stretch hip flexors, assisted calf stretch from caregiver; 7/1: seated activ/AAROM/PROM LAQ and heel slides for quad  and HS tone    Consulted and Agree with Plan of Care  Patient;Family member/caregiver    Family Member Consulted  husband       Patient will benefit from skilled therapeutic intervention in order to improve the following deficits and impairments:  Abnormal gait, Decreased activity tolerance, Decreased balance, Decreased coordination, Decreased endurance, Decreased mobility, Decreased range of motion, Decreased strength, Difficulty walking, Hypomobility, Increased fascial restricitons, Impaired flexibility, Impaired tone, Impaired UE functional use, Pain  Visit Diagnosis: Hemiplegia and hemiparesis following cerebral infarction affecting left dominant side (HCC)  Muscle weakness (generalized)  Difficulty in walking, not elsewhere  classified  Other symptoms and signs involving the nervous system     Problem List Patient Active Problem List   Diagnosis Date Noted  . CVA (cerebral vascular accident) ([facility]) 07/29/2017  . Hypertension 08/19/2013  . HLD (hyperlipidemia) 08/19/2013       Jac Canavan PT, DPT  [facility] [facility] [address] [place], [place], [address] Phone: [phone]   Fax:  [phone]  Name: Tiffany King MRN: [id number] Date of Birth: [birth year]

## 2017-12-09 ENCOUNTER — Ambulatory Visit (HOSPITAL_COMMUNITY): Payer: BLUE CROSS/BLUE SHIELD

## 2017-12-09 DIAGNOSIS — R262 Difficulty in walking, not elsewhere classified: Secondary | ICD-10-CM

## 2017-12-09 DIAGNOSIS — I69352 Hemiplegia and hemiparesis following cerebral infarction affecting left dominant side: Secondary | ICD-10-CM

## 2017-12-09 DIAGNOSIS — M6281 Muscle weakness (generalized): Secondary | ICD-10-CM

## 2017-12-09 DIAGNOSIS — R29818 Other symptoms and signs involving the nervous system: Secondary | ICD-10-CM

## 2017-12-09 NOTE — Therapy (Signed)
[facility] [address] [place], [place], [address] Phone: [phone]   Fax:  [phone]  Physical Therapy Treatment  Patient Details  Name: Tiffany JewelsVickie M Berg MRN: [id number] Date of Birth: 07/09/1955 Referring Provider: Kirstie PeriAshish Shah, MD   Encounter Date: 12/09/2017  PT End of Session - 12/09/17 1342    Visit Number  4 not charging pt for today's visit, therefore she has only used 22/30 visits    Number of Visits  12    Date for PT Re-Evaluation  01/13/18    Authorization Type  BCBS Other    Authorization Time Period  11/18/17 to 01/13/18    Authorization - Visit Number  22 7 visits used for HHPT, 10 OPOT appointments, + 3PT    Authorization - Number of Visits  30    PT Start Time  1343    PT Stop Time  1409    PT Time Calculation (min)  26 min    Equipment Utilized During Treatment  Gait belt    Activity Tolerance  Patient tolerated treatment well;No increased pain    Behavior During Therapy  WFL for tasks assessed/performed       Past Medical History:  Diagnosis Date  . Hyperlipidemia   . Hypertension   . Stroke [facility](HCC)     Past Surgical History:  Procedure Laterality Date  . TUBAL LIGATION      There were no vitals filed for this visit.  Subjective Assessment - 12/09/17 1343    Subjective  Pt states that she was tired following her last session. They made an appointment with [employer] for Monday at 1pm.    Limitations  Walking;Standing;House hold activities    How long can you sit comfortably?  no issues    How long can you stand comfortably?  "a long time"    How long can you walk comfortably?  100 some feet    Patient Stated Goals  to walk and stand in a classroom    Currently in Pain?  Yes    Pain Score  4     Pain Location  Hip    Pain Orientation  Left    Pain Descriptors / Indicators  Sore    Pain Type  Chronic pain    Pain Onset  1 to 4 weeks ago    Pain Frequency  Intermittent    Aggravating Factors   activity     Pain Relieving Factors  sitting    Effect of Pain on Daily Activities  limits          PT Short Term Goals - 11/23/17 1537      PT SHORT TERM GOAL #1   Title  Pt and caregiver will be independent with pt's HEP and perform consistently in order to improve strength and flexibility.    Time  4    Period  Weeks    Status  On-going      PT SHORT TERM GOAL #2   Title  Pt will have 1/2 grade improvement in MMT thorughout BLE in order to maximize transfers, gait, and balance.    Time  4    Period  Weeks    Status  On-going      PT SHORT TERM GOAL #3   Title  Pt will have 7750ft improvement in 3MWT with LRAD and she will demonstrate L foot drop 50% of the time or < in order to demo improved gait, balance, and maximize her household  and community ambluation.    Time  4    Period  Weeks    Status  On-going      PT SHORT TERM GOAL #4   Title  Pt will be able to perform 5xSTS in 35sec or < with improved mechanics in order to demo improved functional strength and improved balance.    Time  4    Period  Weeks    Status  On-going      PT SHORT TERM GOAL #5   Title  Pt will have improved TUG time by 5 seconds with LRAD in order to demo improved balance and maximze household ambulation.    Time  4    Period  Weeks    Status  On-going        PT Long Term Goals - 11/23/17 1537      PT LONG TERM GOAL #1   Title  Pt will have 1 grade improvement in MMT throughout BLE in order to further maximize gait, balance, and functional mobility.    Time  8    Period  Weeks    Status  On-going      PT LONG TERM GOAL #2   Title  Pt will 138ft improvement in with LRAD in order to further maximize household and community ambulation.    Time  8    Period  Weeks    Status  On-going      PT LONG TERM GOAL #3   Title  Pt will be able to perofrm 5xSTS in 30 sec or < with mechanics WFL in order to further demo improved functional strength and balance and decrease her risk for falls.    Time  8     Period  Weeks    Status  On-going      PT LONG TERM GOAL #4   Title  Pt will have improved TUG time by 10 sec or > with LRAD in order to further demo improved balance and functional mobility.    Time  8    Period  Weeks    Status  On-going      PT LONG TERM GOAL #5   Title  Pt will be supervision to modified independent with car transfers in order to maximzie pt's independence with functional mobility.    Time  8    Period  Weeks    Status  On-going            Plan - 12/09/17 1431    Clinical Impression Statement  PT spoke with pt and her husband regarding her limitations in insurance coverage as she has used 22/30 visits for the year, which is a hard max. PT spent entire session discussing pt's options for pro bono clinics in the surrounding area and to ask her MD about baclofen for her tone/spasticity. At this time, pt will be placed on hold for 1 month so that she can work on her tone at home with the exercises already provided so as to not exhaust her remaining visits for the rest of her coverage period which ends 06/08/18. PT had pt and her family schedule an appointment for OPPT in 1 month in case she needs to come back due to not receiving her AFO (has appointment 12/14/17 with [employer]), not having gotten in with one of the pro bono clinics, or for any unforeseen setbacks during this hold period. However, anticipate full d/c at that time. Pt and her husband verbalized that they  will contact the pro bono clinics provided today and will work on her tone at home. No intervention provided today so PT not charging pt for this visit, which leaves her with 8 visits for the remainder of her benefit period.    Rehab Potential  Fair    PT Frequency  2x / week    PT Duration  8 weeks    PT Treatment/Interventions  ADLs/Self Care Home Management;Aquatic Therapy;Biofeedback;Cryotherapy;Electrical Stimulation;Moist Heat;Ultrasound;DME Instruction;Gait training;Stair training;Functional mobility  training;Therapeutic activities;Therapeutic exercise;Balance training;Neuromuscular re-education;Patient/family education;Orthotic Fit/Training;Wheelchair mobility training;Manual techniques;Scar mobilization;Passive range of motion;Dry needling;Energy conservation;Splinting;Taping    PT Next Visit Plan  place on hold for pt to work on tone at home with husband assisting as needed    PT Home Exercise Plan  eval: lying supine to stretch hip flexors, assisted calf stretch from caregiver; 7/1: seated activ/AAROM/PROM LAQ and heel slides for quad  and HS tone    Consulted and Agree with Plan of Care  Patient;Family member/caregiver    Family Member Consulted  husband       Patient will benefit from skilled therapeutic intervention in order to improve the following deficits and impairments:  Abnormal gait, Decreased activity tolerance, Decreased balance, Decreased coordination, Decreased endurance, Decreased mobility, Decreased range of motion, Decreased strength, Difficulty walking, Hypomobility, Increased fascial restricitons, Impaired flexibility, Impaired tone, Impaired UE functional use, Pain  Visit Diagnosis: Hemiplegia and hemiparesis following cerebral infarction affecting left dominant side (HCC)  Muscle weakness (generalized)  Difficulty in walking, not elsewhere classified  Other symptoms and signs involving the nervous system     Problem List Patient Active Problem List   Diagnosis Date Noted  . CVA (cerebral vascular accident) (HCC) 07/29/2017  . Hypertension 08/19/2013  . HLD (hyperlipidemia) 08/19/2013        Jac Canavan PT, DPT   [facility] [address] [place], [place], [address] Phone: [phone]   Fax:  [phone]  Name: NEKO MCGEEHAN MRN: [id number] Date of Birth: 01-Apr-1956

## 2017-12-14 ENCOUNTER — Ambulatory Visit (HOSPITAL_COMMUNITY): Payer: BLUE CROSS/BLUE SHIELD

## 2017-12-16 ENCOUNTER — Encounter (HOSPITAL_COMMUNITY): Payer: BLUE CROSS/BLUE SHIELD

## 2018-01-06 ENCOUNTER — Ambulatory Visit (HOSPITAL_COMMUNITY): Payer: BLUE CROSS/BLUE SHIELD

## 2018-01-06 ENCOUNTER — Telehealth (HOSPITAL_COMMUNITY): Payer: Self-pay | Admitting: Internal Medicine

## 2018-01-06 NOTE — Telephone Encounter (Signed)
01/06/18  pt actually called 7/30 to see why she had this appt... Nehemiah SettleBrooke said it was scheduled a month ago and the patient to keep it if she felt she needed to - pt cx via phone tree - I tried calling but no answer and unable to leave a message

## 2018-04-22 ENCOUNTER — Encounter (HOSPITAL_COMMUNITY): Payer: Self-pay | Admitting: Physical Therapy

## 2018-04-22 NOTE — Therapy (Signed)
[place], [place], [address] Phone: [phone]   Fax:  [phone]  Patient Details  Name: Tiffany King MRN: [id number] Date of Birth: 1955-09-10 Referring Provider:  No ref. provider found  Encounter Date: 04/22/2018   PHYSICAL THERAPY DISCHARGE SUMMARY  Visits from Start of Care: 4  Current functional level related to goals / functional outcomes: PT still limited with ambulation    Remaining deficits: Strength, balance and ambulating    Education / Equipment: HEP, Plan: Patient agrees to discharge.  Patient goals were not met. Patient is being discharged due to financial reasons.  ?????    Rayetta Humphrey, PT CLT [phone] 04/22/2018, 2:41 PM  [facility] [place] [address] [place], [place], [address] Phone: [phone]   Fax:  [phone]

## 2019-01-20 ENCOUNTER — Other Ambulatory Visit: Payer: Self-pay

## 2019-01-20 ENCOUNTER — Emergency Department (HOSPITAL_COMMUNITY)
Admission: EM | Admit: 2019-01-20 | Discharge: 2019-01-20 | Disposition: A | Discharge status: Home / Self Care | Payer: BC Managed Care – PPO | Attending: Emergency Medicine | Admitting: Emergency Medicine

## 2019-01-20 ENCOUNTER — Encounter (HOSPITAL_COMMUNITY): Payer: Self-pay

## 2019-01-20 DIAGNOSIS — E785 Hyperlipidemia, unspecified: Secondary | ICD-10-CM | POA: Insufficient documentation

## 2019-01-20 DIAGNOSIS — Z8673 Personal history of transient ischemic attack (TIA), and cerebral infarction without residual deficits: Secondary | ICD-10-CM | POA: Diagnosis not present

## 2019-01-20 DIAGNOSIS — B029 Zoster without complications: Secondary | ICD-10-CM | POA: Diagnosis not present

## 2019-01-20 DIAGNOSIS — Z79899 Other long term (current) drug therapy: Secondary | ICD-10-CM | POA: Diagnosis not present

## 2019-01-20 DIAGNOSIS — I10 Essential (primary) hypertension: Secondary | ICD-10-CM | POA: Insufficient documentation

## 2019-01-20 DIAGNOSIS — R21 Rash and other nonspecific skin eruption: Secondary | ICD-10-CM | POA: Diagnosis present

## 2019-01-20 MED ORDER — ACYCLOVIR 800 MG PO TABS: 800.0000 mg | ORAL_TABLET | Freq: Every day | ORAL | 0 refills | Status: AC

## 2019-01-20 NOTE — ED Triage Notes (Signed)
Pt reports that she started burning on her left side last week. She went to her dr and was placed on abt for UTI and then developed rash. And started on another antibiotic. Pt has red area like vesicle areas on abd and burning to left side

## 2019-01-20 NOTE — ED Provider Notes (Addendum)
[facility] EMERGENCY DEPARTMENT Provider Note   CSN: [id number] Arrival date & time: 01/20/19  9528     History   Chief Complaint Chief Complaint  Patient presents with  . Rash    HPI Tiffany King is a 63 y.o. female.     The history is provided by the patient. No language interpreter was used.  Rash Location:  Torso Torso rash location:  Abd LUQ Quality: itchiness and redness   Severity:  Moderate Onset quality:  Gradual Timing:  Constant Progression:  Worsening Chronicity:  New Relieved by:  Nothing Worsened by:  Nothing Ineffective treatments:  None tried Associated symptoms: myalgias   Associated symptoms: no abdominal pain and no fever    Pt reports rash goes around to her back. Pt reports stinging and itching.  Pt is on an antibiotic for uti.  She thoughts she might be allergic  Past Medical History:  Diagnosis Date  . Hyperlipidemia   . Hypertension   . Stroke [facility])     Patient Active Problem List   Diagnosis Date Noted  . CVA (cerebral vascular accident) ([facility]) 07/29/2017  . Hypertension 08/19/2013  . HLD (hyperlipidemia) 08/19/2013    Past Surgical History:  Procedure Laterality Date  . TUBAL LIGATION       OB History   No obstetric history on file.      Home Medications    Prior to Admission medications   Medication Sig Start Date End Date Taking? Authorizing Provider  acetaminophen (TYLENOL) 500 MG tablet Take 250-500 mg by mouth every 6 (six) hours as needed. For pain    [provider]  acyclovir (ZOVIRAX) 800 MG tablet Take 1 tablet (800 mg total) by mouth 5 (five) times daily. 01/20/19   Fransico Meadow, PA-C  Ascorbic Acid (VITAMIN C PO) Take 1 tablet by mouth daily.    [provider]  aspirin 325 MG tablet Take 1 tablet (325 mg total) by mouth daily. 07/30/17   Eugenie Filler, MD  atorvastatin (LIPITOR) 40 MG tablet Take 1 tablet (40 mg total) by mouth daily at 6 PM. 07/29/17   Eugenie Filler, MD   co-enzyme Q-10 30 MG capsule Take 30 mg by mouth 3 (three) times daily.    [provider]  famotidine (PEPCID) 20 MG tablet Take 1 tablet (20 mg total) by mouth 2 (two) times daily. Take one tablet twice daily for two days Patient taking differently: Take 20 mg by mouth 2 (two) times daily as needed for heartburn. Take one tablet twice daily for two days 06/19/17   Carmin Muskrat, MD  fluticasone [facility]) 50 MCG/ACT nasal spray Place 2 sprays into both nostrils daily.    [provider]  lisinopril (PRINIVIL,ZESTRIL) 20 MG tablet Take 20 mg by mouth daily.     [provider]  methocarbamol (ROBAXIN) 500 MG tablet Take 1 tablet (500 mg total) by mouth every 8 (eight) hours as needed for muscle spasms. 10/09/17   Julianne Rice, MD  nitroGLYCERIN (NITROSTAT) 0.4 MG SL tablet Place 0.4 mg under the tongue every 5 (five) minutes as needed.  09/18/17   [provider]  Omega-3 Fatty Acids (FISH OIL) 1000 MG CAPS Take by mouth.    [provider]  verapamil (CALAN) 120 MG tablet Take 120 mg by mouth daily.     [provider]    Family History Family History  Problem Relation Age of Onset  . Stroke Mother   . Heart  disease Mother     Social History Social History   Tobacco Use  . Smoking status: Never Smoker  . Smokeless tobacco: Never Used  Substance Use Topics  . Alcohol use: No  . Drug use: No     Allergies   Shellfish allergy and Bee venom   Review of Systems Review of Systems  Constitutional: Negative for fever.  Gastrointestinal: Negative for abdominal pain.  Musculoskeletal: Positive for myalgias.  Skin: Positive for rash.  All other systems reviewed and are negative.    Physical Exam Updated Vital Signs BP (!) 208/106 (BP Location: Right Arm)   Pulse 96   Temp 98 F (36.7 C) (Oral)   Resp 16   SpO2 100%   Physical Exam Vitals signs reviewed.  Neck:     Musculoskeletal: Normal range of motion.   Cardiovascular:     Rate and Rhythm: Normal rate.     Pulses: Normal pulses.  Pulmonary:     Effort: Pulmonary effort is normal.  Abdominal:     General: Abdomen is flat.  Musculoskeletal: Normal range of motion.  Skin:    General: Skin is warm.  Neurological:     General: No focal deficit present.     Mental Status: She is alert.  Psychiatric:        Mood and Affect: Mood normal.      ED Treatments / Results  Labs (all labs ordered are listed, but only abnormal results are displayed) Labs Reviewed - No data to display  EKG None  Radiology No results found.  Procedures Procedures (including critical care time)  Medications Ordered in ED Medications - No data to display   Initial Impression / Assessment and Plan / ED Course  I have reviewed the triage vital signs and the nursing notes.  Pertinent labs & imaging results that were available during my care of the patient were reviewed by me and considered in my medical decision making (see chart for details).       MDM   Rash seems to be shingles, Pt advised to finish antibiotics  As prescribed   Final Clinical Impressions(s) / ED Diagnoses   Final diagnoses:  Herpes zoster without complication    ED Discharge Orders         Ordered    acyclovir (ZOVIRAX) 800 MG tablet  5 times daily     01/20/19 1010        An After Visit Summary was printed and given to the patient.    Elson AreasSofia,  K, PA-C 01/20/19 1014    [address],  K, [place] JerseyPA-C 01/20/19 1016    Milagros Lollykstra, Richard S, MD 01/21/19 [phone]

## 2019-01-20 NOTE — Discharge Instructions (Signed)
See your Physician for recheck in 2-3 days.  Finish your antibiotic for uti

## 2019-08-30 DIAGNOSIS — Z299 Encounter for prophylactic measures, unspecified: Secondary | ICD-10-CM | POA: Diagnosis not present

## 2019-08-30 DIAGNOSIS — I10 Essential (primary) hypertension: Secondary | ICD-10-CM | POA: Diagnosis not present

## 2019-08-30 DIAGNOSIS — E7800 Pure hypercholesterolemia, unspecified: Secondary | ICD-10-CM | POA: Diagnosis not present

## 2019-08-30 DIAGNOSIS — Z8673 Personal history of transient ischemic attack (TIA), and cerebral infarction without residual deficits: Secondary | ICD-10-CM | POA: Diagnosis not present

## 2019-08-30 DIAGNOSIS — Z789 Other specified health status: Secondary | ICD-10-CM | POA: Diagnosis not present

## 2019-09-03 ENCOUNTER — Emergency Department (HOSPITAL_COMMUNITY)
Admission: EM | Admit: 2019-09-03 | Discharge: 2019-09-03 | Disposition: A | Discharge status: Home / Self Care | Payer: Medicare Other | Attending: Emergency Medicine | Admitting: Emergency Medicine

## 2019-09-03 ENCOUNTER — Encounter (HOSPITAL_COMMUNITY): Payer: Self-pay

## 2019-09-03 ENCOUNTER — Emergency Department (HOSPITAL_COMMUNITY): Payer: Medicare Other

## 2019-09-03 ENCOUNTER — Other Ambulatory Visit: Payer: Self-pay

## 2019-09-03 DIAGNOSIS — R110 Nausea: Secondary | ICD-10-CM | POA: Diagnosis not present

## 2019-09-03 DIAGNOSIS — Z79899 Other long term (current) drug therapy: Secondary | ICD-10-CM | POA: Diagnosis not present

## 2019-09-03 DIAGNOSIS — R0789 Other chest pain: Secondary | ICD-10-CM | POA: Diagnosis not present

## 2019-09-03 DIAGNOSIS — Z8673 Personal history of transient ischemic attack (TIA), and cerebral infarction without residual deficits: Secondary | ICD-10-CM | POA: Diagnosis not present

## 2019-09-03 DIAGNOSIS — Z7982 Long term (current) use of aspirin: Secondary | ICD-10-CM | POA: Insufficient documentation

## 2019-09-03 DIAGNOSIS — R1013 Epigastric pain: Secondary | ICD-10-CM | POA: Diagnosis not present

## 2019-09-03 DIAGNOSIS — I10 Essential (primary) hypertension: Secondary | ICD-10-CM | POA: Diagnosis not present

## 2019-09-03 DIAGNOSIS — R079 Chest pain, unspecified: Secondary | ICD-10-CM | POA: Diagnosis not present

## 2019-09-03 LAB — BASIC METABOLIC PANEL
Anion gap: 10 (ref 5–15)
BUN: 10 mg/dL (ref 8–23)
CO2: 26 mmol/L (ref 22–32)
Calcium: 10.1 mg/dL (ref 8.9–10.3)
Chloride: 106 mmol/L (ref 98–111)
Creatinine, Ser: 0.83 mg/dL (ref 0.44–1.00)
GFR calc Af Amer: >60 mL/min (ref >60)
GFR calc non Af Amer: >60 mL/min (ref >60)
Glucose, Bld: 109 mg/dL — ABNORMAL HIGH (ref 70–99)
Potassium: 3.7 mmol/L (ref 3.5–5.1)
Sodium: 142 mmol/L (ref 135–145)

## 2019-09-03 LAB — CBC WITH DIFFERENTIAL/PLATELET
Abs Immature Granulocytes: 0.01 10*3/uL (ref 0.00–0.07)
Basophils Absolute: 0 10*3/uL (ref 0.0–0.1)
Basophils Relative: 0 %
Eosinophils Absolute: 0.1 10*3/uL (ref 0.0–0.5)
Eosinophils Relative: 3 %
HCT: 41.3 % (ref 36.0–46.0)
Hemoglobin: 13.4 g/dL (ref 12.0–15.0)
Immature Granulocytes: 0 %
Lymphocytes Relative: 43 %
Lymphs Abs: 1.9 10*3/uL (ref 0.7–4.0)
MCH: 28.2 pg (ref 26.0–34.0)
MCHC: 32.4 g/dL (ref 30.0–36.0)
MCV: 86.8 fL (ref 80.0–100.0)
Monocytes Absolute: 0.3 10*3/uL (ref 0.1–1.0)
Monocytes Relative: 7 %
Neutro Abs: 2 10*3/uL (ref 1.7–7.7)
Neutrophils Relative %: 47 %
Platelets: 252 10*3/uL (ref 150–400)
RBC: 4.76 MIL/uL (ref 3.87–5.11)
RDW: 13.7 % (ref 11.5–15.5)
WBC: 4.4 10*3/uL (ref 4.0–10.5)
nRBC: 0 % (ref 0.0–0.2)

## 2019-09-03 LAB — TROPONIN I (HIGH SENSITIVITY): Troponin I (High Sensitivity): 3 ng/L (ref <18)

## 2019-09-03 NOTE — Discharge Instructions (Signed)
As we discussed before your pain is most likely caused by your reflux disease.  Please take your medication as prescribed which is generally taken first thing in the morning and then 30 minutes to an hour later you must eat or drink something.  You need to take this medicine consistently for to be effective.  Try and avoid things that may make this worse like alcohol tobacco spicy foods tomato based products fatty foods chocolate or peppermint.

## 2019-09-03 NOTE — ED Triage Notes (Signed)
Pt states she saw her pmd on Wednesday for the chest pain she is currently having, told it was gerd.  Pt states she wants to know why the pain keeps returning and waking her from sleep like this morning.

## 2019-09-03 NOTE — ED Provider Notes (Signed)
[facility] EMERGENCY DEPARTMENT Provider Note   CSN: [id number] Arrival date & time: 09/03/19  0300     History Chief Complaint  Patient presents with  . Chest Pain    Tiffany King is a 64 y.o. female.  64 yo F with a chief complaint of chest pain.  This feels like her reflux disease.  Worse with eating worse laying back flat.  She is here because she is concerned that it keeps recurring and is wondering if it something else.  Has trouble describing the pain.  Denies radiation denies shortness of breath has had some nausea with it.  Denies history of MI.  Has a history of hypertension hyperlipidemia denies diabetes smoking or family history of MI.  Denies history of PE or DVT denies hemoptysis denies unilateral lower extremity edema denies recent surgery immobilization or hospitalization.  The history is provided by the patient.  Chest Pain Pain location:  Epigastric Pain quality: aching   Pain radiates to:  Does not radiate Pain severity:  Moderate Onset quality:  Gradual Duration:  2 days Timing:  Intermittent Progression:  Worsening Chronicity:  Recurrent Relieved by:  Nothing Worsened by:  Nothing Ineffective treatments:  None tried Associated symptoms: abdominal pain and nausea   Associated symptoms: no dizziness, no fever, no headache, no palpitations, no shortness of breath and no vomiting        Past Medical History:  Diagnosis Date  . Hyperlipidemia   . Hypertension   . Stroke [facility])     Patient Active Problem List   Diagnosis Date Noted  . CVA (cerebral vascular accident) (HCC) 07/29/2017  . Hypertension 08/19/2013  . HLD (hyperlipidemia) 08/19/2013    Past Surgical History:  Procedure Laterality Date  . TUBAL LIGATION       OB History   No obstetric history on file.     Family History  Problem Relation Age of Onset  . Stroke Mother   . Heart disease Mother     Social History   Tobacco Use  . Smoking status: Never Smoker  .  Smokeless tobacco: Never Used  Substance Use Topics  . Alcohol use: No  . Drug use: No    Home Medications Prior to Admission medications   Medication Sig Start Date End Date Taking? Authorizing Provider  acetaminophen (TYLENOL) 500 MG tablet Take 250-500 mg by mouth every 6 (six) hours as needed. For pain    [provider]  acyclovir (ZOVIRAX) 800 MG tablet Take 1 tablet (800 mg total) by mouth 5 (five) times daily. 01/20/19   Elson Areas, PA-C  Ascorbic Acid (VITAMIN C PO) Take 1 tablet by mouth daily.    [provider]  aspirin 325 MG tablet Take 1 tablet (325 mg total) by mouth daily. 07/30/17   [name], MD  atorvastatin (LIPITOR) 40 MG tablet Take 1 tablet (40 mg total) by mouth daily at 6 PM. 07/29/17   [name], MD  co-enzyme Q-10 30 MG capsule Take 30 mg by mouth 3 (three) times daily.    [provider]  famotidine (PEPCID) 20 MG tablet Take 1 tablet (20 mg total) by mouth 2 (two) times daily. Take one tablet twice daily for two days Patient taking differently: Take 20 mg by mouth 2 (two) times daily as needed for heartburn. Take one tablet twice daily for two days 06/19/17   [name], MD  fluticasone [facility]) 50 MCG/ACT nasal spray Place 2 sprays into both nostrils  daily.    [provider]  lisinopril (PRINIVIL,ZESTRIL) 20 MG tablet Take 20 mg by mouth daily.     [provider]  methocarbamol (ROBAXIN) 500 MG tablet Take 1 tablet (500 mg total) by mouth every 8 (eight) hours as needed for muscle spasms. 10/09/17   Julianne Rice, MD  nitroGLYCERIN (NITROSTAT) 0.4 MG SL tablet Place 0.4 mg under the tongue every 5 (five) minutes as needed.  09/18/17   [provider]  Omega-3 Fatty Acids (FISH OIL) 1000 MG CAPS Take by mouth.    [provider]  verapamil (CALAN) 120 MG tablet Take 120 mg by mouth daily.     [provider]    Allergies    Shellfish allergy and Bee  venom  Review of Systems   Review of Systems  Constitutional: Negative for chills and fever.  HENT: Negative for congestion and rhinorrhea.   Eyes: Negative for redness and visual disturbance.  Respiratory: Negative for shortness of breath and wheezing.   Cardiovascular: Positive for chest pain. Negative for palpitations.  Gastrointestinal: Positive for abdominal pain and nausea. Negative for vomiting.  Genitourinary: Negative for dysuria and urgency.  Musculoskeletal: Negative for arthralgias and myalgias.  Skin: Negative for pallor and wound.  Neurological: Negative for dizziness and headaches.    Physical Exam Updated Vital Signs BP (!) 169/97 (BP Location: Right Arm)   Pulse 95   Temp 98.6 F (37 C) (Oral)   Resp 14   Ht 5' 1.5" (1.562 m)   Wt 69.9 kg   SpO2 100%   BMI 28.63 kg/m   Physical Exam Vitals and nursing note reviewed.  Constitutional:      General: She is not in acute distress.    Appearance: She is well-developed. She is not diaphoretic.  HENT:     Head: Normocephalic and atraumatic.  Eyes:     Pupils: Pupils are equal, round, and reactive to light.  Cardiovascular:     Rate and Rhythm: Normal rate and regular rhythm.     Heart sounds: No murmur. No friction rub. No gallop.   Pulmonary:     Effort: Pulmonary effort is normal.     Breath sounds: No wheezing or rales.  Abdominal:     General: There is no distension.     Palpations: Abdomen is soft.     Tenderness: There is no abdominal tenderness.  Musculoskeletal:        General: No tenderness.     Cervical back: Normal range of motion and neck supple.  Skin:    General: Skin is warm and dry.  Neurological:     Mental Status: She is alert and oriented to person, place, and time.  Psychiatric:        Behavior: Behavior normal.     ED Results / Procedures / Treatments   Labs (all labs ordered are listed, but only abnormal results are displayed) Labs Reviewed  BASIC METABOLIC PANEL -  Abnormal; Notable for the following components:      Result Value   Glucose, Bld 109 (*)    All other components within normal limits  CBC WITH DIFFERENTIAL/PLATELET  TROPONIN I (HIGH SENSITIVITY)    EKG EKG Interpretation  Date/Time:  Saturday September 03 2019 03:15:26 EDT Ventricular Rate:  103 PR Interval:    QRS Duration: 84 QT Interval:  338 QTC Calculation: 443 R Axis:   60 Text Interpretation: Sinus tachycardia Borderline T wave abnormalities No significant change since last tracing Confirmed by  Melene Plan [phone]) on 09/03/2019 6:24:22 AM   Radiology DG Chest 2 View  Result Date: 09/03/2019 CLINICAL DATA:  Chest pain EXAM: CHEST - 2 VIEW COMPARISON:  10/09/2017 FINDINGS: The heart size and mediastinal contours are within normal limits. Both lungs are clear. The visualized skeletal structures are unremarkable. IMPRESSION: No active cardiopulmonary disease. Electronically Signed   By: Elige Ko   On: 09/03/2019 05:46    Procedures Procedures (including critical care time)  Medications Ordered in ED Medications - No data to display  ED Course  I have reviewed the triage vital signs and the nursing notes.  Pertinent labs & imaging results that were available during my care of the patient were reviewed by me and considered in my medical decision making (see chart for details).    MDM Rules/Calculators/A&P                      64 yo F with a chief complaint of worsening ingestion.  Patient states it feels like when she had reflux in the past.  Atypical of ACS.  Worse with eating and lying flat.  No significant change in her symptoms in 3 hours.  Will obtain a single troponin chest x-ray lab work reassess.  Troponin is negative.  Lab work is otherwise unremarkable.  Chest x-ray viewed by me without focal infiltrate or pneumothorax.  Will discharge patient home.  Treat as reflux.  7:13 AM:  I have discussed the diagnosis/risks/treatment options with the patient and believe  the pt to be eligible for discharge home to follow-up with PCP. We also discussed returning to the ED immediately if new or worsening sx occur. We discussed the sx which are most concerning (e.g., sudden worsening pain, fever, inability to tolerate by mouth) that necessitate immediate return. Medications administered to the patient during their visit and any new prescriptions provided to the patient are listed below.  Medications given during this visit Medications - No data to display   The patient appears reasonably screen and/or stabilized for discharge and I doubt any other medical condition or other [facility] requiring further screening, evaluation, or treatment in the ED at this time prior to discharge.   Final Clinical Impression(s) / ED Diagnoses Final diagnoses:  Atypical chest pain    Rx / DC Orders ED Discharge Orders    None       Melene Plan, DO 09/03/19 [phone]

## 2019-09-12 DIAGNOSIS — K219 Gastro-esophageal reflux disease without esophagitis: Secondary | ICD-10-CM | POA: Diagnosis not present

## 2019-09-12 DIAGNOSIS — Z299 Encounter for prophylactic measures, unspecified: Secondary | ICD-10-CM | POA: Diagnosis not present

## 2019-09-12 DIAGNOSIS — R079 Chest pain, unspecified: Secondary | ICD-10-CM | POA: Diagnosis not present

## 2019-09-12 DIAGNOSIS — I10 Essential (primary) hypertension: Secondary | ICD-10-CM | POA: Diagnosis not present

## 2019-09-12 DIAGNOSIS — I69354 Hemiplegia and hemiparesis following cerebral infarction affecting left non-dominant side: Secondary | ICD-10-CM | POA: Diagnosis not present

## 2019-09-22 DIAGNOSIS — R350 Frequency of micturition: Secondary | ICD-10-CM | POA: Diagnosis not present

## 2019-09-22 DIAGNOSIS — I10 Essential (primary) hypertension: Secondary | ICD-10-CM | POA: Diagnosis not present

## 2019-09-22 DIAGNOSIS — Z299 Encounter for prophylactic measures, unspecified: Secondary | ICD-10-CM | POA: Diagnosis not present

## 2019-09-30 DIAGNOSIS — Z299 Encounter for prophylactic measures, unspecified: Secondary | ICD-10-CM | POA: Diagnosis not present

## 2019-09-30 DIAGNOSIS — I69354 Hemiplegia and hemiparesis following cerebral infarction affecting left non-dominant side: Secondary | ICD-10-CM | POA: Diagnosis not present

## 2019-09-30 DIAGNOSIS — T7840XA Allergy, unspecified, initial encounter: Secondary | ICD-10-CM | POA: Diagnosis not present

## 2019-09-30 DIAGNOSIS — I10 Essential (primary) hypertension: Secondary | ICD-10-CM | POA: Diagnosis not present

## 2019-10-07 DIAGNOSIS — I10 Essential (primary) hypertension: Secondary | ICD-10-CM | POA: Diagnosis not present

## 2019-10-25 DIAGNOSIS — I69354 Hemiplegia and hemiparesis following cerebral infarction affecting left non-dominant side: Secondary | ICD-10-CM | POA: Diagnosis not present

## 2019-10-25 DIAGNOSIS — Z299 Encounter for prophylactic measures, unspecified: Secondary | ICD-10-CM | POA: Diagnosis not present

## 2019-10-25 DIAGNOSIS — R252 Cramp and spasm: Secondary | ICD-10-CM | POA: Diagnosis not present

## 2019-10-25 DIAGNOSIS — I10 Essential (primary) hypertension: Secondary | ICD-10-CM | POA: Diagnosis not present

## 2019-10-29 DIAGNOSIS — I69354 Hemiplegia and hemiparesis following cerebral infarction affecting left non-dominant side: Secondary | ICD-10-CM | POA: Diagnosis not present

## 2019-10-29 DIAGNOSIS — M79605 Pain in left leg: Secondary | ICD-10-CM | POA: Diagnosis not present

## 2019-10-29 DIAGNOSIS — R29818 Other symptoms and signs involving the nervous system: Secondary | ICD-10-CM | POA: Diagnosis not present

## 2019-10-29 DIAGNOSIS — Z20822 Contact with and (suspected) exposure to covid-19: Secondary | ICD-10-CM | POA: Diagnosis not present

## 2019-10-29 DIAGNOSIS — Z5329 Procedure and treatment not carried out because of patient's decision for other reasons: Secondary | ICD-10-CM | POA: Diagnosis not present

## 2019-10-29 DIAGNOSIS — I639 Cerebral infarction, unspecified: Secondary | ICD-10-CM | POA: Diagnosis not present

## 2019-10-31 DIAGNOSIS — I69354 Hemiplegia and hemiparesis following cerebral infarction affecting left non-dominant side: Secondary | ICD-10-CM | POA: Diagnosis not present

## 2019-10-31 DIAGNOSIS — M6249 Contracture of muscle, multiple sites: Secondary | ICD-10-CM | POA: Diagnosis not present

## 2019-10-31 DIAGNOSIS — I10 Essential (primary) hypertension: Secondary | ICD-10-CM | POA: Diagnosis not present

## 2019-10-31 DIAGNOSIS — Z299 Encounter for prophylactic measures, unspecified: Secondary | ICD-10-CM | POA: Diagnosis not present

## 2019-11-04 DIAGNOSIS — M6240 Contracture of muscle, unspecified site: Secondary | ICD-10-CM | POA: Diagnosis not present

## 2019-11-04 DIAGNOSIS — I69354 Hemiplegia and hemiparesis following cerebral infarction affecting left non-dominant side: Secondary | ICD-10-CM | POA: Diagnosis not present

## 2019-11-06 DIAGNOSIS — I10 Essential (primary) hypertension: Secondary | ICD-10-CM | POA: Diagnosis not present

## 2019-11-16 DIAGNOSIS — I69354 Hemiplegia and hemiparesis following cerebral infarction affecting left non-dominant side: Secondary | ICD-10-CM | POA: Diagnosis not present

## 2019-11-16 DIAGNOSIS — M6240 Contracture of muscle, unspecified site: Secondary | ICD-10-CM | POA: Diagnosis not present

## 2019-11-18 DIAGNOSIS — M6240 Contracture of muscle, unspecified site: Secondary | ICD-10-CM | POA: Diagnosis not present

## 2019-11-18 DIAGNOSIS — I69354 Hemiplegia and hemiparesis following cerebral infarction affecting left non-dominant side: Secondary | ICD-10-CM | POA: Diagnosis not present

## 2019-11-23 DIAGNOSIS — M6240 Contracture of muscle, unspecified site: Secondary | ICD-10-CM | POA: Diagnosis not present

## 2019-11-23 DIAGNOSIS — I69354 Hemiplegia and hemiparesis following cerebral infarction affecting left non-dominant side: Secondary | ICD-10-CM | POA: Diagnosis not present

## 2019-11-25 DIAGNOSIS — M6240 Contracture of muscle, unspecified site: Secondary | ICD-10-CM | POA: Diagnosis not present

## 2019-11-25 DIAGNOSIS — I69354 Hemiplegia and hemiparesis following cerebral infarction affecting left non-dominant side: Secondary | ICD-10-CM | POA: Diagnosis not present

## 2019-11-30 DIAGNOSIS — M6240 Contracture of muscle, unspecified site: Secondary | ICD-10-CM | POA: Diagnosis not present

## 2019-11-30 DIAGNOSIS — K5900 Constipation, unspecified: Secondary | ICD-10-CM | POA: Diagnosis not present

## 2019-11-30 DIAGNOSIS — Z299 Encounter for prophylactic measures, unspecified: Secondary | ICD-10-CM | POA: Diagnosis not present

## 2019-11-30 DIAGNOSIS — I10 Essential (primary) hypertension: Secondary | ICD-10-CM | POA: Diagnosis not present

## 2019-11-30 DIAGNOSIS — I69354 Hemiplegia and hemiparesis following cerebral infarction affecting left non-dominant side: Secondary | ICD-10-CM | POA: Diagnosis not present

## 2019-12-02 DIAGNOSIS — I69354 Hemiplegia and hemiparesis following cerebral infarction affecting left non-dominant side: Secondary | ICD-10-CM | POA: Diagnosis not present

## 2019-12-02 DIAGNOSIS — M6240 Contracture of muscle, unspecified site: Secondary | ICD-10-CM | POA: Diagnosis not present

## 2019-12-07 DIAGNOSIS — I10 Essential (primary) hypertension: Secondary | ICD-10-CM | POA: Diagnosis not present

## 2019-12-14 DIAGNOSIS — I69359 Hemiplegia and hemiparesis following cerebral infarction affecting unspecified side: Secondary | ICD-10-CM | POA: Diagnosis not present

## 2019-12-14 DIAGNOSIS — M6281 Muscle weakness (generalized): Secondary | ICD-10-CM | POA: Diagnosis not present

## 2019-12-14 DIAGNOSIS — G8194 Hemiplegia, unspecified affecting left nondominant side: Secondary | ICD-10-CM | POA: Diagnosis not present

## 2019-12-14 DIAGNOSIS — M6240 Contracture of muscle, unspecified site: Secondary | ICD-10-CM | POA: Diagnosis not present

## 2019-12-14 DIAGNOSIS — I69354 Hemiplegia and hemiparesis following cerebral infarction affecting left non-dominant side: Secondary | ICD-10-CM | POA: Diagnosis not present

## 2019-12-16 DIAGNOSIS — M6240 Contracture of muscle, unspecified site: Secondary | ICD-10-CM | POA: Diagnosis not present

## 2019-12-16 DIAGNOSIS — I69354 Hemiplegia and hemiparesis following cerebral infarction affecting left non-dominant side: Secondary | ICD-10-CM | POA: Diagnosis not present

## 2019-12-19 DIAGNOSIS — M6240 Contracture of muscle, unspecified site: Secondary | ICD-10-CM | POA: Diagnosis not present

## 2019-12-19 DIAGNOSIS — I69354 Hemiplegia and hemiparesis following cerebral infarction affecting left non-dominant side: Secondary | ICD-10-CM | POA: Diagnosis not present

## 2019-12-21 DIAGNOSIS — I69354 Hemiplegia and hemiparesis following cerebral infarction affecting left non-dominant side: Secondary | ICD-10-CM | POA: Diagnosis not present

## 2019-12-21 DIAGNOSIS — I10 Essential (primary) hypertension: Secondary | ICD-10-CM | POA: Diagnosis not present

## 2019-12-21 DIAGNOSIS — Z299 Encounter for prophylactic measures, unspecified: Secondary | ICD-10-CM | POA: Diagnosis not present

## 2019-12-26 DIAGNOSIS — Z789 Other specified health status: Secondary | ICD-10-CM | POA: Diagnosis not present

## 2019-12-26 DIAGNOSIS — R252 Cramp and spasm: Secondary | ICD-10-CM | POA: Diagnosis not present

## 2019-12-26 DIAGNOSIS — Z299 Encounter for prophylactic measures, unspecified: Secondary | ICD-10-CM | POA: Diagnosis not present

## 2019-12-26 DIAGNOSIS — I672 Cerebral atherosclerosis: Secondary | ICD-10-CM | POA: Diagnosis not present

## 2019-12-26 DIAGNOSIS — M6240 Contracture of muscle, unspecified site: Secondary | ICD-10-CM | POA: Diagnosis not present

## 2019-12-26 DIAGNOSIS — I69354 Hemiplegia and hemiparesis following cerebral infarction affecting left non-dominant side: Secondary | ICD-10-CM | POA: Diagnosis not present

## 2019-12-26 DIAGNOSIS — I10 Essential (primary) hypertension: Secondary | ICD-10-CM | POA: Diagnosis not present

## 2019-12-28 DIAGNOSIS — M6240 Contracture of muscle, unspecified site: Secondary | ICD-10-CM | POA: Diagnosis not present

## 2019-12-28 DIAGNOSIS — I69354 Hemiplegia and hemiparesis following cerebral infarction affecting left non-dominant side: Secondary | ICD-10-CM | POA: Diagnosis not present

## 2020-01-03 DIAGNOSIS — M6281 Muscle weakness (generalized): Secondary | ICD-10-CM | POA: Diagnosis not present

## 2020-01-03 DIAGNOSIS — I69354 Hemiplegia and hemiparesis following cerebral infarction affecting left non-dominant side: Secondary | ICD-10-CM | POA: Diagnosis not present

## 2020-01-03 DIAGNOSIS — M6240 Contracture of muscle, unspecified site: Secondary | ICD-10-CM | POA: Diagnosis not present

## 2020-01-05 DIAGNOSIS — M6240 Contracture of muscle, unspecified site: Secondary | ICD-10-CM | POA: Diagnosis not present

## 2020-01-05 DIAGNOSIS — M6281 Muscle weakness (generalized): Secondary | ICD-10-CM | POA: Diagnosis not present

## 2020-01-05 DIAGNOSIS — I69354 Hemiplegia and hemiparesis following cerebral infarction affecting left non-dominant side: Secondary | ICD-10-CM | POA: Diagnosis not present

## 2020-01-06 DIAGNOSIS — I10 Essential (primary) hypertension: Secondary | ICD-10-CM | POA: Diagnosis not present

## 2020-01-11 DIAGNOSIS — I69354 Hemiplegia and hemiparesis following cerebral infarction affecting left non-dominant side: Secondary | ICD-10-CM | POA: Diagnosis not present

## 2020-01-11 DIAGNOSIS — M6240 Contracture of muscle, unspecified site: Secondary | ICD-10-CM | POA: Diagnosis not present

## 2020-01-11 DIAGNOSIS — M6281 Muscle weakness (generalized): Secondary | ICD-10-CM | POA: Diagnosis not present

## 2020-01-16 DIAGNOSIS — M6281 Muscle weakness (generalized): Secondary | ICD-10-CM | POA: Diagnosis not present

## 2020-01-16 DIAGNOSIS — I69354 Hemiplegia and hemiparesis following cerebral infarction affecting left non-dominant side: Secondary | ICD-10-CM | POA: Diagnosis not present

## 2020-01-16 DIAGNOSIS — M6240 Contracture of muscle, unspecified site: Secondary | ICD-10-CM | POA: Diagnosis not present

## 2020-01-23 DIAGNOSIS — I69354 Hemiplegia and hemiparesis following cerebral infarction affecting left non-dominant side: Secondary | ICD-10-CM | POA: Diagnosis not present

## 2020-01-23 DIAGNOSIS — M6281 Muscle weakness (generalized): Secondary | ICD-10-CM | POA: Diagnosis not present

## 2020-01-23 DIAGNOSIS — M6240 Contracture of muscle, unspecified site: Secondary | ICD-10-CM | POA: Diagnosis not present

## 2020-01-26 DIAGNOSIS — M6281 Muscle weakness (generalized): Secondary | ICD-10-CM | POA: Diagnosis not present

## 2020-01-26 DIAGNOSIS — I69354 Hemiplegia and hemiparesis following cerebral infarction affecting left non-dominant side: Secondary | ICD-10-CM | POA: Diagnosis not present

## 2020-01-26 DIAGNOSIS — M6240 Contracture of muscle, unspecified site: Secondary | ICD-10-CM | POA: Diagnosis not present

## 2020-02-02 DIAGNOSIS — M6281 Muscle weakness (generalized): Secondary | ICD-10-CM | POA: Diagnosis not present

## 2020-02-02 DIAGNOSIS — I69354 Hemiplegia and hemiparesis following cerebral infarction affecting left non-dominant side: Secondary | ICD-10-CM | POA: Diagnosis not present

## 2020-02-07 DIAGNOSIS — I10 Essential (primary) hypertension: Secondary | ICD-10-CM | POA: Diagnosis not present

## 2020-03-07 DIAGNOSIS — Z7189 Other specified counseling: Secondary | ICD-10-CM | POA: Diagnosis not present

## 2020-03-07 DIAGNOSIS — Z0000 Encounter for general adult medical examination without abnormal findings: Secondary | ICD-10-CM | POA: Diagnosis not present

## 2020-03-07 DIAGNOSIS — Z299 Encounter for prophylactic measures, unspecified: Secondary | ICD-10-CM | POA: Diagnosis not present

## 2020-03-07 DIAGNOSIS — I10 Essential (primary) hypertension: Secondary | ICD-10-CM | POA: Diagnosis not present

## 2020-03-08 DIAGNOSIS — I10 Essential (primary) hypertension: Secondary | ICD-10-CM | POA: Diagnosis not present

## 2020-04-07 DIAGNOSIS — I10 Essential (primary) hypertension: Secondary | ICD-10-CM | POA: Diagnosis not present

## 2020-05-02 DIAGNOSIS — E7800 Pure hypercholesterolemia, unspecified: Secondary | ICD-10-CM | POA: Diagnosis not present

## 2020-05-02 DIAGNOSIS — R5383 Other fatigue: Secondary | ICD-10-CM | POA: Diagnosis not present

## 2020-05-02 DIAGNOSIS — Z79899 Other long term (current) drug therapy: Secondary | ICD-10-CM | POA: Diagnosis not present

## 2020-05-08 DIAGNOSIS — I10 Essential (primary) hypertension: Secondary | ICD-10-CM | POA: Diagnosis not present

## 2021-01-30 DIAGNOSIS — I10 Essential (primary) hypertension: Secondary | ICD-10-CM | POA: Diagnosis not present

## 2021-01-30 DIAGNOSIS — R350 Frequency of micturition: Secondary | ICD-10-CM | POA: Diagnosis not present

## 2021-01-30 DIAGNOSIS — Z299 Encounter for prophylactic measures, unspecified: Secondary | ICD-10-CM | POA: Diagnosis not present

## 2021-01-30 DIAGNOSIS — N390 Urinary tract infection, site not specified: Secondary | ICD-10-CM | POA: Diagnosis not present

## 2021-02-18 DIAGNOSIS — J029 Acute pharyngitis, unspecified: Secondary | ICD-10-CM | POA: Diagnosis not present

## 2021-02-18 DIAGNOSIS — J020 Streptococcal pharyngitis: Secondary | ICD-10-CM | POA: Diagnosis not present

## 2021-02-18 DIAGNOSIS — Z299 Encounter for prophylactic measures, unspecified: Secondary | ICD-10-CM | POA: Diagnosis not present

## 2021-03-08 DIAGNOSIS — I10 Essential (primary) hypertension: Secondary | ICD-10-CM | POA: Diagnosis not present

## 2021-03-08 DIAGNOSIS — E7800 Pure hypercholesterolemia, unspecified: Secondary | ICD-10-CM | POA: Diagnosis not present

## 2021-05-01 DIAGNOSIS — R5383 Other fatigue: Secondary | ICD-10-CM | POA: Diagnosis not present

## 2021-05-01 DIAGNOSIS — Z0000 Encounter for general adult medical examination without abnormal findings: Secondary | ICD-10-CM | POA: Diagnosis not present

## 2021-05-01 DIAGNOSIS — I10 Essential (primary) hypertension: Secondary | ICD-10-CM | POA: Diagnosis not present

## 2021-05-01 DIAGNOSIS — Z7189 Other specified counseling: Secondary | ICD-10-CM | POA: Diagnosis not present

## 2021-05-01 DIAGNOSIS — Z299 Encounter for prophylactic measures, unspecified: Secondary | ICD-10-CM | POA: Diagnosis not present

## 2021-05-01 DIAGNOSIS — E7800 Pure hypercholesterolemia, unspecified: Secondary | ICD-10-CM | POA: Diagnosis not present

## 2021-05-01 DIAGNOSIS — Z79899 Other long term (current) drug therapy: Secondary | ICD-10-CM | POA: Diagnosis not present

## 2021-05-08 DIAGNOSIS — Z713 Dietary counseling and surveillance: Secondary | ICD-10-CM | POA: Diagnosis not present

## 2021-05-08 DIAGNOSIS — I10 Essential (primary) hypertension: Secondary | ICD-10-CM | POA: Diagnosis not present

## 2021-05-08 DIAGNOSIS — R079 Chest pain, unspecified: Secondary | ICD-10-CM | POA: Diagnosis not present

## 2021-05-08 DIAGNOSIS — Z299 Encounter for prophylactic measures, unspecified: Secondary | ICD-10-CM | POA: Diagnosis not present

## 2021-05-15 DIAGNOSIS — R531 Weakness: Secondary | ICD-10-CM | POA: Diagnosis not present

## 2021-05-15 DIAGNOSIS — R079 Chest pain, unspecified: Secondary | ICD-10-CM | POA: Diagnosis not present

## 2021-05-15 DIAGNOSIS — I10 Essential (primary) hypertension: Secondary | ICD-10-CM | POA: Diagnosis not present

## 2021-05-15 DIAGNOSIS — Z299 Encounter for prophylactic measures, unspecified: Secondary | ICD-10-CM | POA: Diagnosis not present

## 2021-06-07 DIAGNOSIS — R911 Solitary pulmonary nodule: Secondary | ICD-10-CM | POA: Diagnosis not present

## 2021-06-07 DIAGNOSIS — R0789 Other chest pain: Secondary | ICD-10-CM | POA: Diagnosis not present

## 2021-06-07 DIAGNOSIS — N200 Calculus of kidney: Secondary | ICD-10-CM | POA: Diagnosis not present

## 2021-06-07 DIAGNOSIS — R918 Other nonspecific abnormal finding of lung field: Secondary | ICD-10-CM | POA: Diagnosis not present

## 2021-06-07 DIAGNOSIS — Z041 Encounter for examination and observation following transport accident: Secondary | ICD-10-CM | POA: Diagnosis not present

## 2021-06-07 DIAGNOSIS — N281 Cyst of kidney, acquired: Secondary | ICD-10-CM | POA: Diagnosis not present

## 2021-06-07 DIAGNOSIS — I10 Essential (primary) hypertension: Secondary | ICD-10-CM | POA: Diagnosis not present

## 2021-06-07 DIAGNOSIS — Z8673 Personal history of transient ischemic attack (TIA), and cerebral infarction without residual deficits: Secondary | ICD-10-CM | POA: Diagnosis not present

## 2021-06-07 DIAGNOSIS — S299XXA Unspecified injury of thorax, initial encounter: Secondary | ICD-10-CM | POA: Diagnosis not present

## 2021-07-09 DIAGNOSIS — E7800 Pure hypercholesterolemia, unspecified: Secondary | ICD-10-CM | POA: Diagnosis not present

## 2021-07-09 DIAGNOSIS — I10 Essential (primary) hypertension: Secondary | ICD-10-CM | POA: Diagnosis not present

## 2021-07-16 DIAGNOSIS — I10 Essential (primary) hypertension: Secondary | ICD-10-CM | POA: Diagnosis not present

## 2021-07-16 DIAGNOSIS — Z299 Encounter for prophylactic measures, unspecified: Secondary | ICD-10-CM | POA: Diagnosis not present

## 2021-07-16 DIAGNOSIS — E7800 Pure hypercholesterolemia, unspecified: Secondary | ICD-10-CM | POA: Diagnosis not present

## 2021-07-16 DIAGNOSIS — Z789 Other specified health status: Secondary | ICD-10-CM | POA: Diagnosis not present

## 2021-07-16 DIAGNOSIS — K219 Gastro-esophageal reflux disease without esophagitis: Secondary | ICD-10-CM | POA: Diagnosis not present

## 2021-07-16 DIAGNOSIS — Z2821 Immunization not carried out because of patient refusal: Secondary | ICD-10-CM | POA: Diagnosis not present

## 2021-07-25 DIAGNOSIS — Z789 Other specified health status: Secondary | ICD-10-CM | POA: Diagnosis not present

## 2021-07-25 DIAGNOSIS — I10 Essential (primary) hypertension: Secondary | ICD-10-CM | POA: Diagnosis not present

## 2021-07-25 DIAGNOSIS — Z299 Encounter for prophylactic measures, unspecified: Secondary | ICD-10-CM | POA: Diagnosis not present

## 2021-07-25 DIAGNOSIS — I69354 Hemiplegia and hemiparesis following cerebral infarction affecting left non-dominant side: Secondary | ICD-10-CM | POA: Diagnosis not present

## 2021-08-12 ENCOUNTER — Other Ambulatory Visit: Payer: Self-pay

## 2021-08-12 ENCOUNTER — Emergency Department (HOSPITAL_COMMUNITY): Payer: Medicare Other

## 2021-08-12 ENCOUNTER — Emergency Department (HOSPITAL_COMMUNITY)
Admission: EM | Admit: 2021-08-12 | Discharge: 2021-08-12 | Disposition: A | Discharge status: Home / Self Care | Payer: Medicare Other | Attending: Emergency Medicine | Admitting: Emergency Medicine

## 2021-08-12 ENCOUNTER — Encounter (HOSPITAL_COMMUNITY): Payer: Self-pay | Admitting: *Deleted

## 2021-08-12 DIAGNOSIS — Z7982 Long term (current) use of aspirin: Secondary | ICD-10-CM | POA: Insufficient documentation

## 2021-08-12 DIAGNOSIS — S79912A Unspecified injury of left hip, initial encounter: Secondary | ICD-10-CM | POA: Diagnosis not present

## 2021-08-12 DIAGNOSIS — W010XXA Fall on same level from slipping, tripping and stumbling without subsequent striking against object, initial encounter: Secondary | ICD-10-CM | POA: Insufficient documentation

## 2021-08-12 DIAGNOSIS — S8992XA Unspecified injury of left lower leg, initial encounter: Secondary | ICD-10-CM | POA: Insufficient documentation

## 2021-08-12 MED ORDER — ACETAMINOPHEN ER 650 MG PO TBCR: 650.0000 mg | EXTENDED_RELEASE_TABLET | Freq: Three times a day (TID) | ORAL | 0 refills | Status: AC | PRN
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
## 2021-08-12 NOTE — Discharge Instructions (Addendum)
It was a pleasure taking care of you today. As discussed, your x-ray did not show any broken bones. I am sending you home with tylenol. Take as needed for pain. Follow-up with PCP within 1 week for further evaluation. Return to the ER for new or worsening symptoms.  ?

## 2021-08-12 NOTE — ED Triage Notes (Signed)
Also has pain in left hip area ? ?
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
## 2021-08-12 NOTE — ED Notes (Signed)
Pt able to ambulate to baseline. Pt limps but generally uses a cane when walking. ?
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
## 2021-10-21 DIAGNOSIS — I10 Essential (primary) hypertension: Secondary | ICD-10-CM | POA: Diagnosis not present

## 2021-10-21 DIAGNOSIS — J310 Chronic rhinitis: Secondary | ICD-10-CM | POA: Diagnosis not present

## 2021-10-21 DIAGNOSIS — Z713 Dietary counseling and surveillance: Secondary | ICD-10-CM | POA: Diagnosis not present

## 2021-10-21 DIAGNOSIS — Z299 Encounter for prophylactic measures, unspecified: Secondary | ICD-10-CM | POA: Diagnosis not present

## 2021-11-03 IMAGING — DX DG CHEST 2V
1 series · 1 of 1 positions shown · non-contrast
Comparison: 10/09/2017

CLINICAL DATA: Chest pain

EXAM:
CHEST - 2 VIEW

[chest lat]
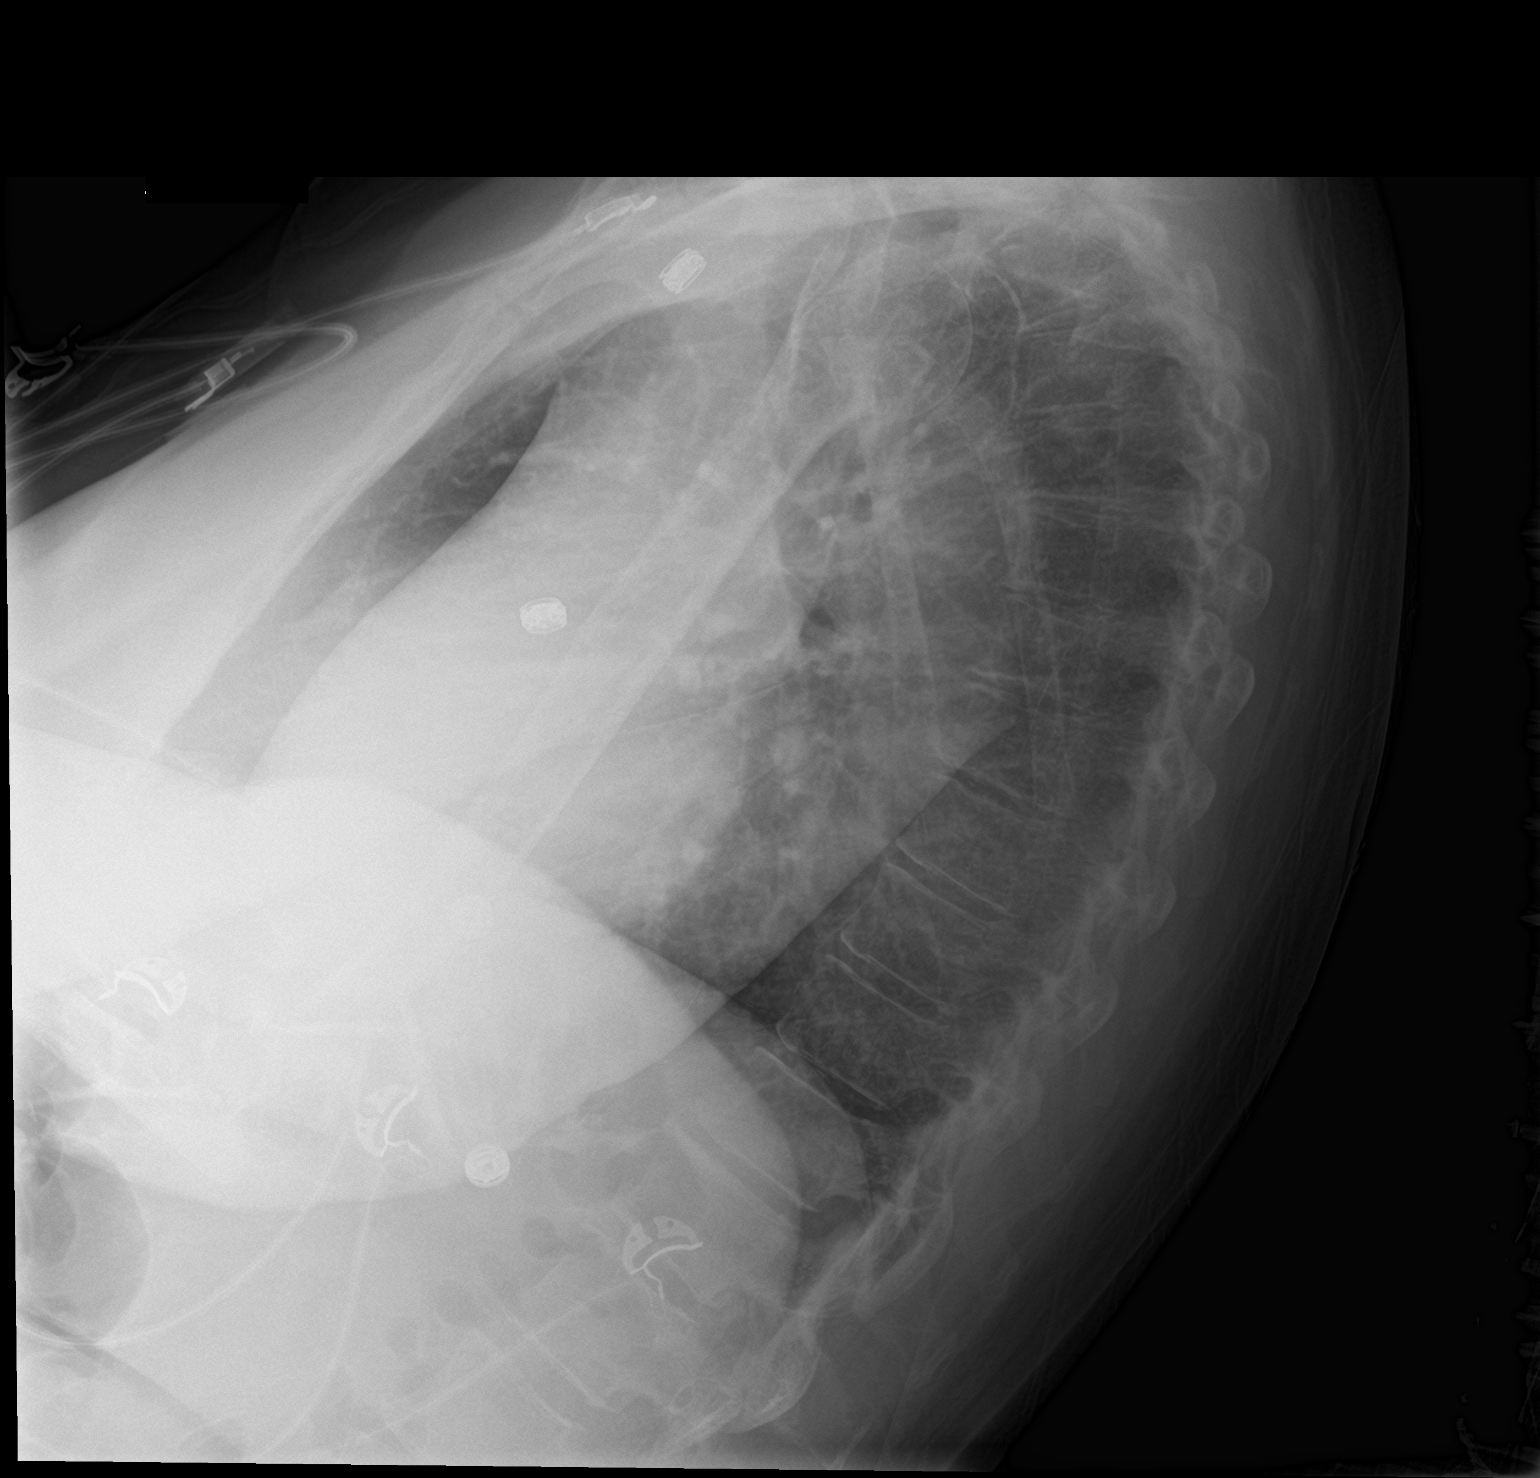

[1 of 1 positions shown; findings below may reference images not displayed]

FINDINGS: The heart size and mediastinal contours are within normal limits.
Both lungs are clear. The visualized skeletal structures are
unremarkable.
IMPRESSION: No active cardiopulmonary disease.

## 2022-01-22 DIAGNOSIS — Z299 Encounter for prophylactic measures, unspecified: Secondary | ICD-10-CM | POA: Diagnosis not present

## 2022-01-22 DIAGNOSIS — I10 Essential (primary) hypertension: Secondary | ICD-10-CM | POA: Diagnosis not present

## 2022-01-22 DIAGNOSIS — Z713 Dietary counseling and surveillance: Secondary | ICD-10-CM | POA: Diagnosis not present

## 2022-03-11 ENCOUNTER — Other Ambulatory Visit: Payer: Self-pay | Admitting: Internal Medicine

## 2022-03-11 DIAGNOSIS — Z1231 Encounter for screening mammogram for malignant neoplasm of breast: Secondary | ICD-10-CM

## 2022-04-02 DIAGNOSIS — K219 Gastro-esophageal reflux disease without esophagitis: Secondary | ICD-10-CM | POA: Diagnosis not present

## 2022-04-02 DIAGNOSIS — Z299 Encounter for prophylactic measures, unspecified: Secondary | ICD-10-CM | POA: Diagnosis not present

## 2022-04-02 DIAGNOSIS — I10 Essential (primary) hypertension: Secondary | ICD-10-CM | POA: Diagnosis not present

## 2022-04-02 DIAGNOSIS — I69354 Hemiplegia and hemiparesis following cerebral infarction affecting left non-dominant side: Secondary | ICD-10-CM | POA: Diagnosis not present

## 2022-05-06 DIAGNOSIS — I69354 Hemiplegia and hemiparesis following cerebral infarction affecting left non-dominant side: Secondary | ICD-10-CM | POA: Diagnosis not present

## 2022-05-06 DIAGNOSIS — Z79899 Other long term (current) drug therapy: Secondary | ICD-10-CM | POA: Diagnosis not present

## 2022-05-06 DIAGNOSIS — I10 Essential (primary) hypertension: Secondary | ICD-10-CM | POA: Diagnosis not present

## 2022-05-06 DIAGNOSIS — Z0000 Encounter for general adult medical examination without abnormal findings: Secondary | ICD-10-CM | POA: Diagnosis not present

## 2022-05-06 DIAGNOSIS — Z2821 Immunization not carried out because of patient refusal: Secondary | ICD-10-CM | POA: Diagnosis not present

## 2022-05-06 DIAGNOSIS — Z7189 Other specified counseling: Secondary | ICD-10-CM | POA: Diagnosis not present

## 2022-05-06 DIAGNOSIS — E7800 Pure hypercholesterolemia, unspecified: Secondary | ICD-10-CM | POA: Diagnosis not present

## 2022-05-06 DIAGNOSIS — R5383 Other fatigue: Secondary | ICD-10-CM | POA: Diagnosis not present

## 2022-05-06 DIAGNOSIS — Z299 Encounter for prophylactic measures, unspecified: Secondary | ICD-10-CM | POA: Diagnosis not present

## 2022-06-16 DIAGNOSIS — N390 Urinary tract infection, site not specified: Secondary | ICD-10-CM | POA: Diagnosis not present

## 2022-06-16 DIAGNOSIS — R350 Frequency of micturition: Secondary | ICD-10-CM | POA: Diagnosis not present

## 2022-06-16 DIAGNOSIS — N183 Chronic kidney disease, stage 3 unspecified: Secondary | ICD-10-CM | POA: Diagnosis not present

## 2022-06-16 DIAGNOSIS — Z299 Encounter for prophylactic measures, unspecified: Secondary | ICD-10-CM | POA: Diagnosis not present

## 2022-06-16 DIAGNOSIS — I10 Essential (primary) hypertension: Secondary | ICD-10-CM | POA: Diagnosis not present

## 2022-06-17 ENCOUNTER — Inpatient Hospital Stay: Admission: RE | Admit: 2022-06-17 | Payer: Medicare Other | Source: Ambulatory Visit

## 2022-10-28 DIAGNOSIS — I10 Essential (primary) hypertension: Secondary | ICD-10-CM | POA: Diagnosis not present

## 2022-10-28 DIAGNOSIS — Z299 Encounter for prophylactic measures, unspecified: Secondary | ICD-10-CM | POA: Diagnosis not present

## 2022-10-28 DIAGNOSIS — N183 Chronic kidney disease, stage 3 unspecified: Secondary | ICD-10-CM | POA: Diagnosis not present

## 2023-04-23 ENCOUNTER — Telehealth: Payer: Self-pay

## 2023-04-23 NOTE — Patient Outreach (Signed)
[facility] Assistant attempted to call patient on today regarding preventative mammogram screening. No answer from patient after multiple rings. Assistant left confidential voicemail for patient to return call.  Will call back patient back for final attempt.  Tiffany King [facility] Assistant [facility] [phone]

## 2023-09-29 DIAGNOSIS — Z7189 Other specified counseling: Secondary | ICD-10-CM | POA: Diagnosis not present

## 2023-09-29 DIAGNOSIS — Z0000 Encounter for general adult medical examination without abnormal findings: Secondary | ICD-10-CM | POA: Diagnosis not present

## 2023-09-29 DIAGNOSIS — Z79899 Other long term (current) drug therapy: Secondary | ICD-10-CM | POA: Diagnosis not present

## 2023-09-29 DIAGNOSIS — E7800 Pure hypercholesterolemia, unspecified: Secondary | ICD-10-CM | POA: Diagnosis not present

## 2023-09-29 DIAGNOSIS — R5383 Other fatigue: Secondary | ICD-10-CM | POA: Diagnosis not present

## 2023-09-29 DIAGNOSIS — Z299 Encounter for prophylactic measures, unspecified: Secondary | ICD-10-CM | POA: Diagnosis not present

## 2023-09-29 DIAGNOSIS — Z1389 Encounter for screening for other disorder: Secondary | ICD-10-CM | POA: Diagnosis not present

## 2023-09-30 DIAGNOSIS — R5383 Other fatigue: Secondary | ICD-10-CM | POA: Diagnosis not present

## 2023-09-30 DIAGNOSIS — E7800 Pure hypercholesterolemia, unspecified: Secondary | ICD-10-CM | POA: Diagnosis not present

## 2023-09-30 DIAGNOSIS — Z79899 Other long term (current) drug therapy: Secondary | ICD-10-CM | POA: Diagnosis not present

## 2023-10-13 IMAGING — DX DG HIP (WITH OR WITHOUT PELVIS) 2-3V*L*
3 series · 3 of 3 positions shown · non-contrast
Comparison: None.

CLINICAL DATA: Trauma, fall

EXAM:
DG HIP (WITH OR WITHOUT PELVIS) 2-3V LEFT

[pelvis ap]
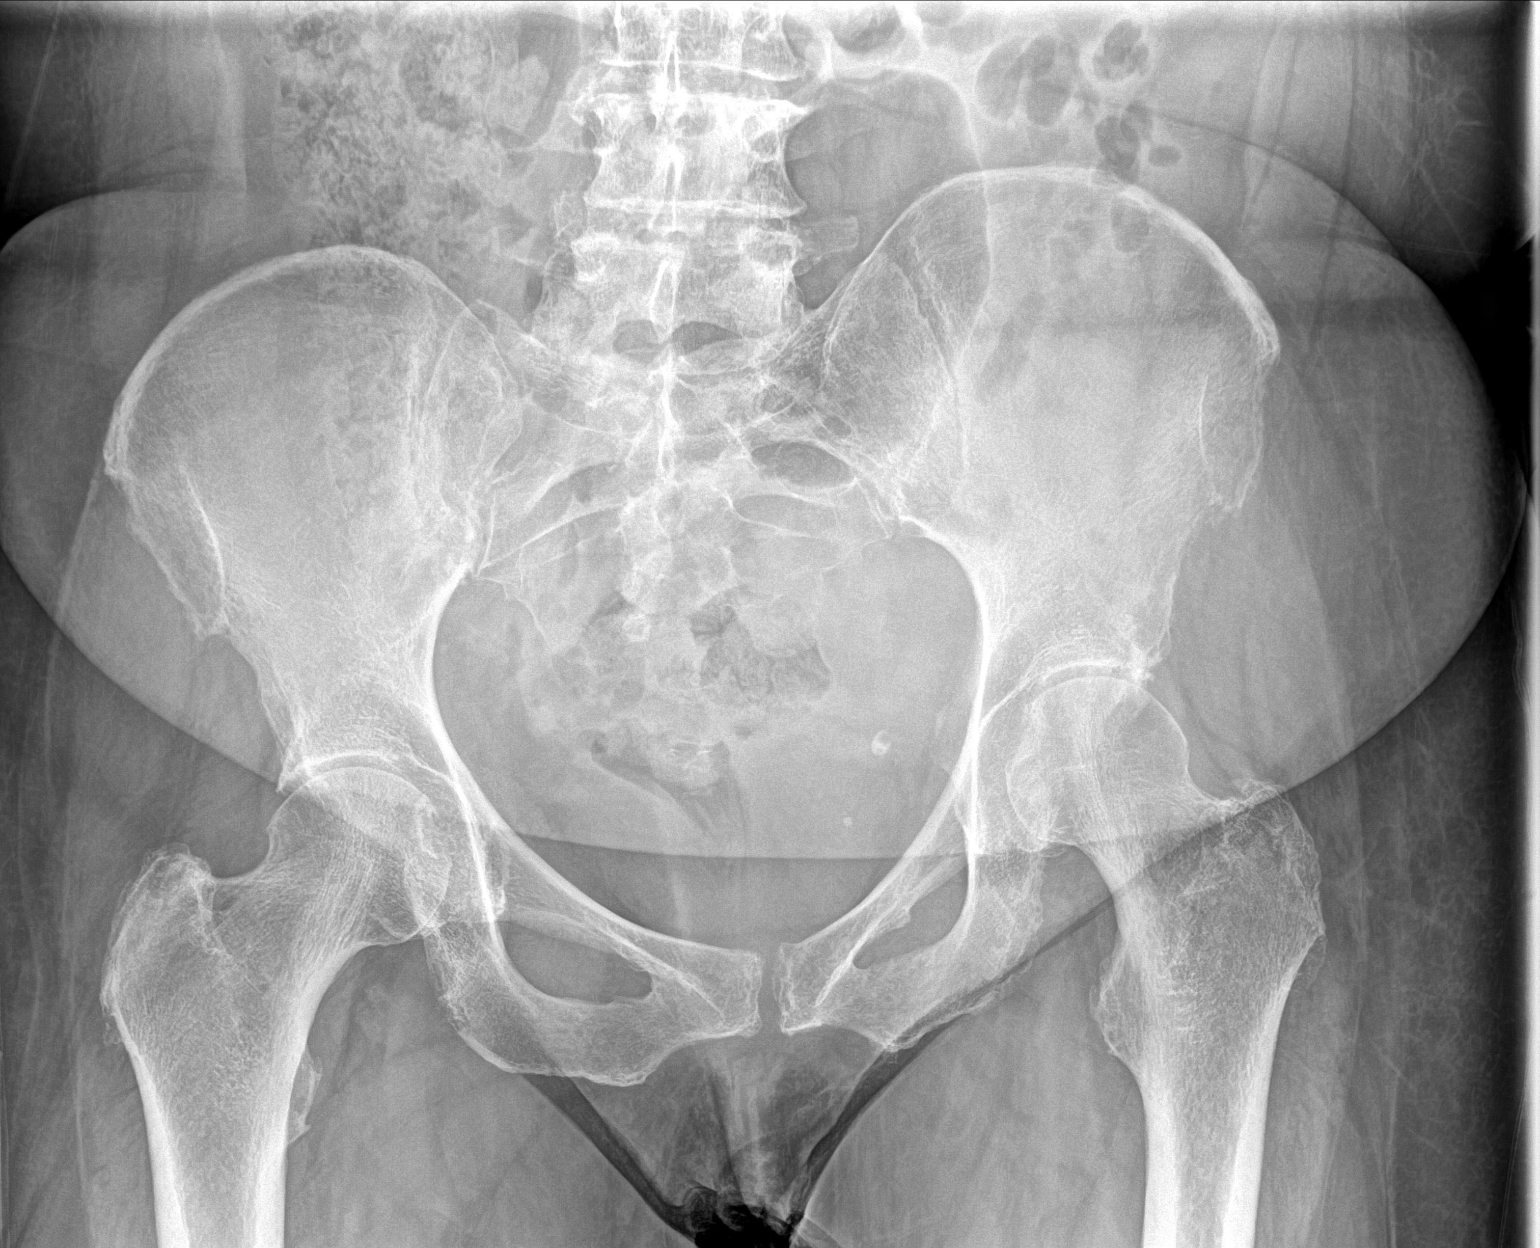

[hip ap]
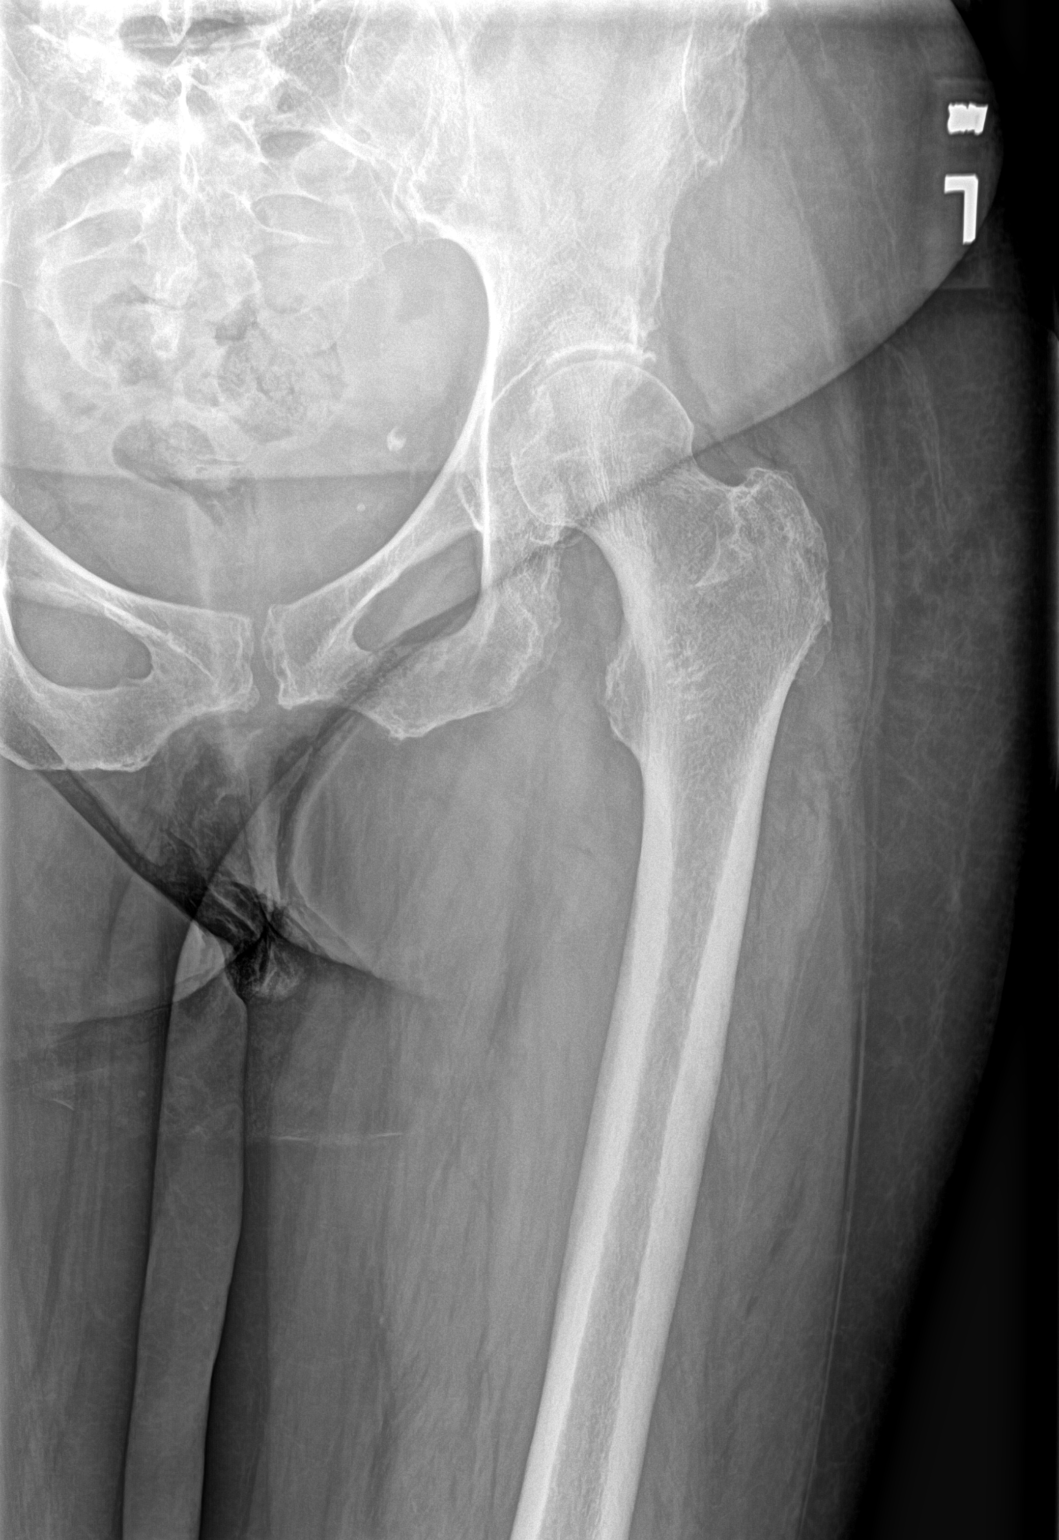

[hip frog leg]
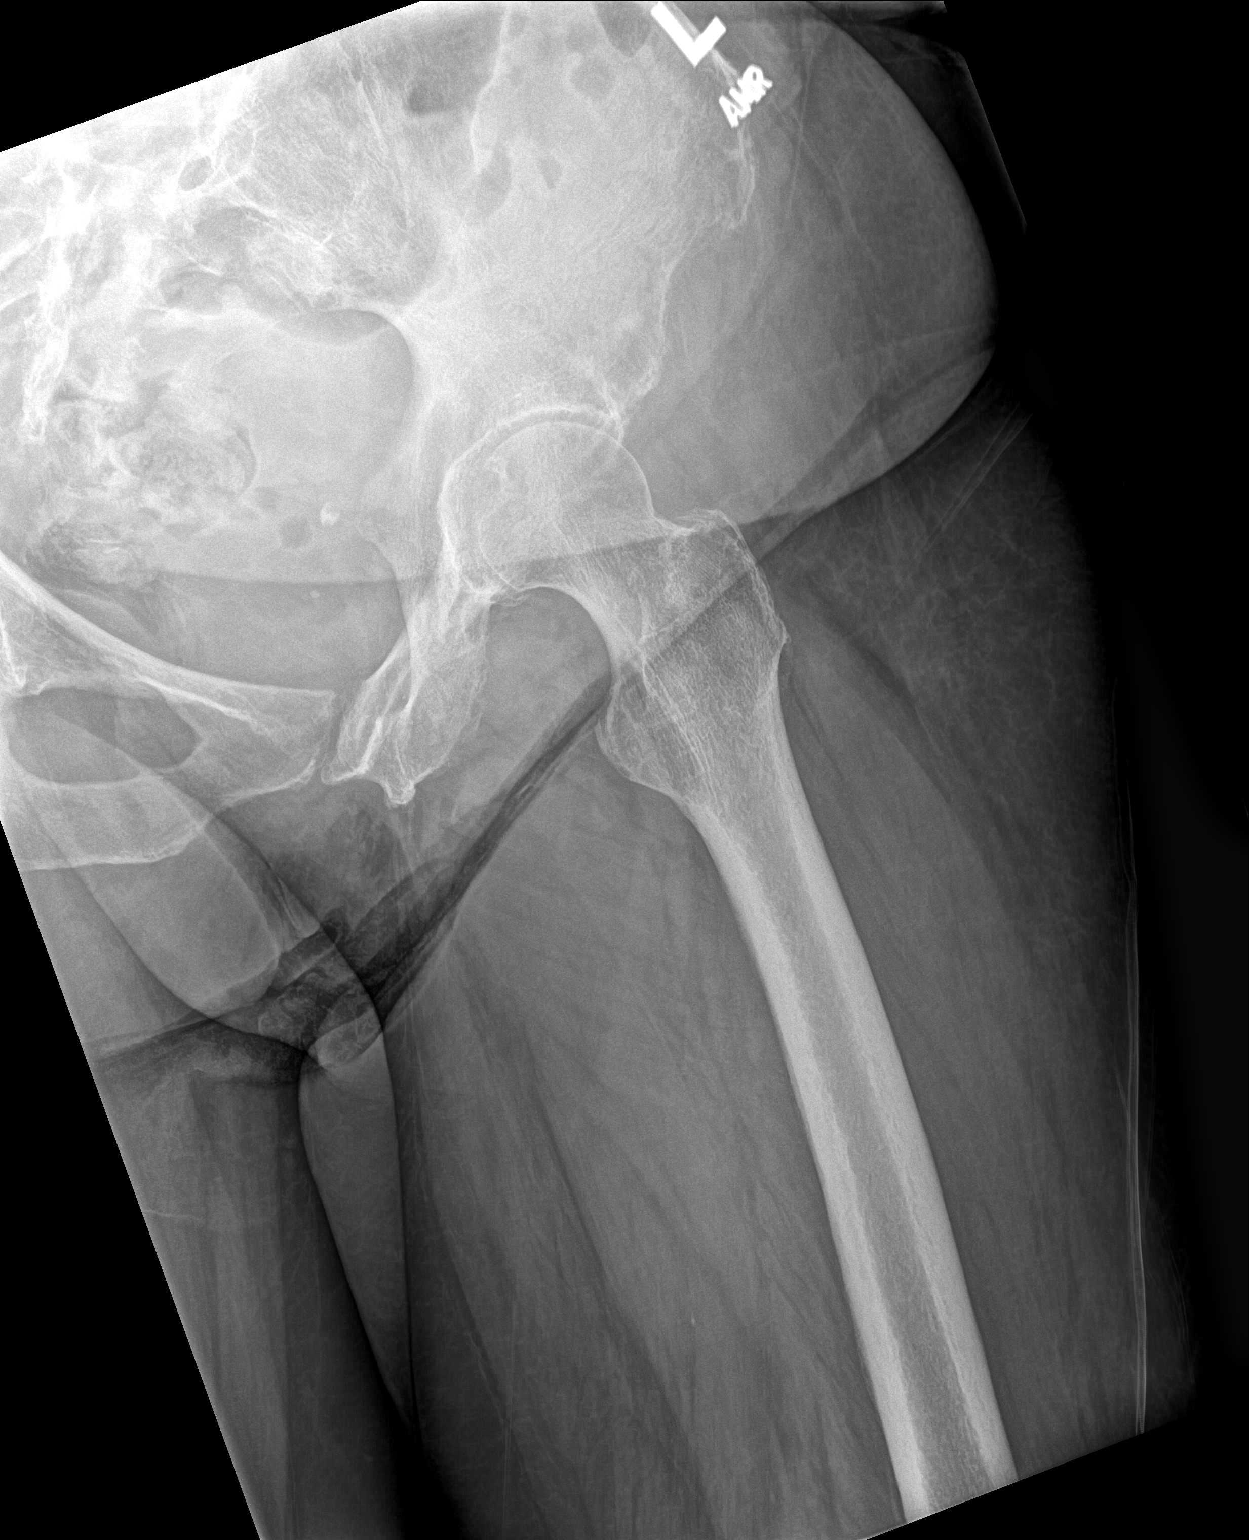

[3 of 3 positions shown; findings below may reference images not displayed]

FINDINGS: No fracture or dislocation is seen. There is no significant
narrowing of joint space. Degenerative changes are noted in the
visualized lower lumbar spine. Minimal bony spurs seen in the right
hip.
IMPRESSION: No recent fracture or dislocation is seen in the left hip.

## 2023-12-15 DIAGNOSIS — L89124 Pressure ulcer of left upper back, stage 4: Secondary | ICD-10-CM | POA: Diagnosis not present

## 2023-12-15 DIAGNOSIS — R339 Retention of urine, unspecified: Secondary | ICD-10-CM | POA: Diagnosis not present

## 2024-04-21 ENCOUNTER — Emergency Department (HOSPITAL_COMMUNITY)
Admission: EM | Admit: 2024-04-21 | Discharge: 2024-04-21 | Disposition: A | Discharge status: Home / Self Care | Attending: Emergency Medicine | Admitting: Emergency Medicine

## 2024-04-21 ENCOUNTER — Other Ambulatory Visit: Payer: Self-pay

## 2024-04-21 ENCOUNTER — Encounter (HOSPITAL_COMMUNITY): Payer: Self-pay | Admitting: Emergency Medicine

## 2024-04-21 DIAGNOSIS — N390 Urinary tract infection, site not specified: Secondary | ICD-10-CM | POA: Insufficient documentation

## 2024-04-21 DIAGNOSIS — Z79899 Other long term (current) drug therapy: Secondary | ICD-10-CM | POA: Diagnosis not present

## 2024-04-21 DIAGNOSIS — I10 Essential (primary) hypertension: Secondary | ICD-10-CM | POA: Insufficient documentation

## 2024-04-21 DIAGNOSIS — Z7982 Long term (current) use of aspirin: Secondary | ICD-10-CM | POA: Diagnosis not present

## 2024-04-21 DIAGNOSIS — R300 Dysuria: Secondary | ICD-10-CM | POA: Diagnosis present

## 2024-04-21 LAB — BASIC METABOLIC PANEL WITH GFR
Anion gap: 13 (ref 5–15)
BUN: 11 mg/dL (ref 8–23)
CO2: 28 mmol/L (ref 22–32)
Calcium: 10.2 mg/dL (ref 8.9–10.3)
Chloride: 102 mmol/L (ref 98–111)
Creatinine, Ser: 0.87 mg/dL (ref 0.44–1.00)
GFR, Estimated: >60 mL/min (ref >60)
Glucose, Bld: 97 mg/dL (ref 70–99)
Potassium: 3.5 mmol/L (ref 3.5–5.1)
Sodium: 143 mmol/L (ref 135–145)

## 2024-04-21 LAB — URINALYSIS, ROUTINE W REFLEX MICROSCOPIC
Bilirubin Urine: NEGATIVE
Glucose, UA: NEGATIVE mg/dL
Ketones, ur: NEGATIVE mg/dL
Nitrite: NEGATIVE
Protein, ur: 100 mg/dL — AB
Specific Gravity, Urine: 1.024 (ref 1.005–1.030)
WBC, UA: >50 WBC/hpf (ref 0–5)
pH: 5 (ref 5.0–8.0)

## 2024-04-21 LAB — CBC
HCT: 40.4 % (ref 36.0–46.0)
Hemoglobin: 13 g/dL (ref 12.0–15.0)
MCH: 28.2 pg (ref 26.0–34.0)
MCHC: 32.2 g/dL (ref 30.0–36.0)
MCV: 87.6 fL (ref 80.0–100.0)
Platelets: 317 K/uL (ref 150–400)
RBC: 4.61 MIL/uL (ref 3.87–5.11)
RDW: 13.5 % (ref 11.5–15.5)
WBC: 6.9 K/uL (ref 4.0–10.5)
nRBC: 0 % (ref 0.0–0.2)

## 2024-04-21 MED ORDER — CEFPODOXIME PROXETIL 200 MG PO TABS: 200.0000 mg | ORAL_TABLET | Freq: Two times a day (BID) | ORAL | 0 refills | Status: AC

## 2024-04-21 MED ORDER — SODIUM CHLORIDE 0.9 % IV SOLN
1.0000 g | Freq: Once | INTRAVENOUS | Status: AC
  Administered 2024-04-21: 1 g via INTRAVENOUS
  Filled 2024-04-21: qty 10

## 2024-04-21 MED ORDER — CEFPODOXIME PROXETIL 200 MG PO TABS: 200.0000 mg | ORAL_TABLET | Freq: Two times a day (BID) | ORAL | 0 refills | Status: DC

## 2024-04-21 NOTE — ED Provider Notes (Signed)
 [facility]MENT AT [facility] Provider Note  CSN: [id number] Arrival date & time: 04/21/24 1756  Chief Complaint(s) Urinary Frequency and Hypertension  HPI Tiffany EBRAHIMI is a 68 y.o. female history of stroke with left hemiparesis, hypertension, hyperlipidemia presenting to the emergency department with urinary symptoms.  Patient reports painful urination which has been present for few weeks.  Reports she is taking cranberry juice but it has not helped.  Reports that she has some nausea, no vomiting.  No fevers or chills.  Reports that she has some tingling in the right hand 2.  This has been present for a little while.  Denies any flank pain.  Symptoms are constant.   Past Medical History Past Medical History:  Diagnosis Date  . Hyperlipidemia   . Hypertension   . Stroke [facility])    Patient Active Problem List   Diagnosis Date Noted  . CVA (cerebral vascular accident) (HCC) 07/29/2017  . Hypertension 08/19/2013  . HLD (hyperlipidemia) 08/19/2013   Home Medication(s) Prior to Admission medications   Medication Sig Start Date End Date Taking? Authorizing Provider  acetaminophen  (TYLENOL  8 HOUR) 650 MG CR tablet Take 1 tablet (650 mg total) by mouth every 8 (eight) hours as needed for pain. 08/12/21   Aberman, Caroline C, PA-C  acetaminophen  (TYLENOL ) 500 MG tablet Take 250-500 mg by mouth every 6 (six) hours as needed. For pain    [provider]  acyclovir  (ZOVIRAX ) 800 MG tablet Take 1 tablet (800 mg total) by mouth 5 (five) times daily. 01/20/19   Sofia, Leslie K, PA-C  Ascorbic Acid (VITAMIN C PO) Take 1 tablet by mouth daily.    [provider]  aspirin  325 MG tablet Take 1 tablet (325 mg total) by mouth daily. 07/30/17   Sebastian Toribio GAILS, MD  atorvastatin  (LIPITOR) 40 MG tablet Take 1 tablet (40 mg total) by mouth daily at 6 PM. 07/29/17   Sebastian Toribio GAILS, MD  co-enzyme Q-10 30 MG capsule Take 30 mg by mouth 3 (three) times daily.     [provider]  famotidine  (PEPCID ) 20 MG tablet Take 1 tablet (20 mg total) by mouth 2 (two) times daily. Take one tablet twice daily for two days Patient taking differently: Take 20 mg by mouth 2 (two) times daily as needed for heartburn. Take one tablet twice daily for two days 06/19/17   Garrick Charleston, MD  fluticasone [facility]) 50 MCG/ACT nasal spray Place 2 sprays into both nostrils daily.    [provider]  lisinopril  (PRINIVIL ,ZESTRIL ) 20 MG tablet Take 20 mg by mouth daily.     [provider]  methocarbamol  (ROBAXIN ) 500 MG tablet Take 1 tablet (500 mg total) by mouth every 8 (eight) hours as needed for muscle spasms. 10/09/17   Carlyle Lenis, MD  nitroGLYCERIN (NITROSTAT) 0.4 MG SL tablet Place 0.4 mg under the tongue every 5 (five) minutes as needed.  09/18/17   [provider]  Omega-3 Fatty Acids (FISH OIL) 1000 MG CAPS Take by mouth.    [provider]  verapamil (CALAN) 120 MG tablet Take 120 mg by mouth daily.     [provider]  Past Surgical History Past Surgical History:  Procedure Laterality Date  . TUBAL LIGATION     Family History Family History  Problem Relation Age of Onset  . Stroke Mother   . Heart disease Mother     Social History Social History   Tobacco Use  . Smoking status: Never  . Smokeless tobacco: Never  Substance Use Topics  . Alcohol use: No  . Drug use: No   Allergies Shellfish allergy and Bee venom  Review of Systems Review of Systems  All other systems reviewed and are negative.   Physical Exam Vital Signs  I have reviewed the triage vital signs BP (!) 186/111 (BP Location: Right Arm)   Pulse 91   Temp 98.5 F (36.9 C) (Oral)   Resp 16   Wt 74.8 kg   SpO2 99%   BMI 30.67 kg/m  Physical Exam Vitals and nursing note reviewed.   Constitutional:      General: She is not in acute distress.    Appearance: She is well-developed.  HENT:     Head: Normocephalic and atraumatic.     Mouth/Throat:     Mouth: Mucous membranes are moist.  Eyes:     Pupils: Pupils are equal, round, and reactive to light.  Cardiovascular:     Rate and Rhythm: Normal rate and regular rhythm.     Heart sounds: No murmur heard. Pulmonary:     Effort: Pulmonary effort is normal. No respiratory distress.     Breath sounds: Normal breath sounds.  Abdominal:     General: Abdomen is flat.     Palpations: Abdomen is soft.     Tenderness: There is no abdominal tenderness. There is no right CVA tenderness or left CVA tenderness.  Musculoskeletal:        General: No tenderness.     Right lower leg: No edema.     Left lower leg: No edema.  Skin:    General: Skin is warm and dry.  Neurological:     General: No focal deficit present.     Mental Status: She is alert. Mental status is at baseline.     Comments: Chronic left hemiparesis, no sensory deficit in the right upper extremity, strength 5 out of 5 in the right upper and right lower extremities  Psychiatric:        Mood and Affect: Mood normal.        Behavior: Behavior normal.     ED Results and Treatments Labs (all labs ordered are listed, but only abnormal results are displayed) Labs Reviewed  URINALYSIS, ROUTINE W REFLEX MICROSCOPIC - Abnormal; Notable for the following components:      Result Value   APPearance CLOUDY (*)    Hgb urine dipstick SMALL (*)    Protein, ur 100 (*)    Leukocytes,Ua LARGE (*)    Bacteria, UA RARE (*)    All other components within normal limits  BASIC METABOLIC PANEL WITH GFR  CBC  Radiology No results found.  Pertinent labs & imaging results that were available during my care of the patient were reviewed by me and considered  in my medical decision making (see MDM for details).  Medications Ordered in ED Medications  cefTRIAXone (ROCEPHIN) 1 g in sodium chloride 0.9 % 100 mL IVPB (has no administration in time range)                                                                                                                                     Procedures Procedures  (including critical care time)  Medical Decision Making / ED Course   MDM:  68 year old presenting to the emergency department with urinary symptoms.  Patient overall well-appearing, physical examination with chronic hemiparesis no other objective new finding.  Patient's urine is concerning for UTI which correlates with the patient's symptoms.  Will treat with antibiotic.  Laboratory testing was reassuring with no leukocytosis, normal renal function.  Patient has no CVA tenderness to suggest pyelonephritis, kidney stone and no fevers to suggest any systemic infection.  She reported some vague tingling in her right hand but has no objective findings, doubt any acute neurologic process.  Will treat with ceftriaxone.  Will send urine culture.  Recommended follow-up with PMD and strict return precautions.      Additional history obtained: -Additional history obtained from {wsadditionalhistorian:28072} -External records from outside source obtained and reviewed including: Chart review including previous notes, labs, imaging, consultation notes including ***   Lab Tests: -I ordered, reviewed, and interpreted labs.   The pertinent results include:   Labs Reviewed  URINALYSIS, ROUTINE W REFLEX MICROSCOPIC - Abnormal; Notable for the following components:      Result Value   APPearance CLOUDY (*)    Hgb urine dipstick SMALL (*)    Protein, ur 100 (*)    Leukocytes,Ua LARGE (*)    Bacteria, UA RARE (*)    All other components within normal limits  BASIC METABOLIC PANEL WITH GFR  CBC    Notable for ***  EKG   EKG  Interpretation Date/Time:    Ventricular Rate:    PR Interval:    QRS Duration:    QT Interval:    QTC Calculation:   R Axis:      Text Interpretation:           Imaging Studies ordered: I ordered imaging studies including *** On my interpretation imaging demonstrates *** I independently visualized and interpreted imaging. I agree with the radiologist interpretation   Medicines ordered and prescription drug management: Meds ordered this encounter  Medications  . cefTRIAXone (ROCEPHIN) 1 g in sodium chloride 0.9 % 100 mL IVPB    Antibiotic Indication::   UTI    -I have reviewed the patients home medicines and have made adjustments as needed   Consultations Obtained: I requested consultation with the ***,  and discussed lab and imaging findings as  well as pertinent plan - they recommend: ***   Cardiac Monitoring: The patient was maintained on a cardiac monitor.  I personally viewed and interpreted the cardiac monitored which showed an underlying rhythm of: ***  Social Determinants of Health:  Diagnosis or treatment significantly limited by social determinants of health: {wssoc:28071}   Reevaluation: After the interventions noted above, I reevaluated the patient and found that their symptoms have {resolved/improved/worsened:23923::improved}  Co morbidities that complicate the patient evaluation . Past Medical History:  Diagnosis Date  . Hyperlipidemia   . Hypertension   . Stroke [facility])       Dispostion: Disposition decision including need for hospitalization was considered, and patient {wsdispo:28070::discharged from emergency department.}    Final Clinical Impression(s) / ED Diagnoses Final diagnoses:  None     This chart was dictated using voice recognition software.  Despite best efforts to proofread,  errors can occur which can change the documentation meaning.

## 2024-04-21 NOTE — ED Triage Notes (Signed)
 Pt complains of urinary urgency, burning with urination and low back pain x 3 weeks,pt states  burning goes down left leg and into right hand.

## 2024-04-21 NOTE — Discharge Instructions (Addendum)
 We evaluated you for your urinary symptoms.  Your testing shows signs of a urinary infection.  We have started you on antibiotics.  Please take these as prescribed.  Please follow-up closely with your primary doctor.  If you have any new or worsening symptoms, such as fevers or chills, lightheadedness or dizziness, or fainting, please return to the emergency department.

## 2024-04-21 NOTE — ED Triage Notes (Signed)
 Pt states she hasn't taken bp medication in 2 weeks.

## 2024-04-24 LAB — URINE CULTURE: Culture: >=100000 — AB

## 2024-04-25 ENCOUNTER — Telehealth (HOSPITAL_BASED_OUTPATIENT_CLINIC_OR_DEPARTMENT_OTHER): Payer: Self-pay | Admitting: *Deleted

## 2024-04-25 NOTE — Telephone Encounter (Signed)
 Post ED Visit - Positive Culture Follow-up  Culture report reviewed by antimicrobial stewardship pharmacist: [facility] [x]  Leonor Bash, [place].D. []  Venetia Gully, Pharm.D., BCPS AQ-ID []  Garrel Crews, Pharm.D., BCPS []  Almarie Lunger, Pharm.D., BCPS []  [place], [address].D., BCPS, AAHIVP []  Rosaline Bihari, Pharm.D., BCPS, AAHIVP []  Vernell Meier, PharmD, BCPS []  Latanya Hint, PharmD, BCPS []  Donald Medley, PharmD, BCPS []  Rocky Bold, PharmD []  Dorothyann Alert, PharmD, BCPS []  Morene Babe, PharmD  [facility] []  Rosaline Edison, PharmD []  Romona Bliss, PharmD []  Dolphus Roller, PharmD []  Veva Seip, Rph []  Vernell Daunt) Leonce, PharmD []  Eva Allis, PharmD []  Rosaline Millet, PharmD []  Iantha Batch, PharmD []  Arvin Gauss, PharmD []  Wanda Hasting, PharmD []  Ronal Rav, PharmD []  Rocky Slade, PharmD []  Bard Jeans, PharmD   Positive urine culture Treated with cefpodoxime proxetil, organism sensitive to the same and no further patient follow-up is required at this time.  Tiffany King 04/25/2024, 11:20 AM
